# Patient Record
Sex: Female | Born: 1945
Health system: Southern US, Community
[De-identification: ages and names within clinical notes are randomized; demographics above are authoritative.]

## PROBLEM LIST (undated history)

## (undated) DIAGNOSIS — E041 Nontoxic single thyroid nodule: Secondary | ICD-10-CM

## (undated) DIAGNOSIS — I483 Typical atrial flutter: Secondary | ICD-10-CM

## (undated) DIAGNOSIS — I480 Paroxysmal atrial fibrillation: Secondary | ICD-10-CM

## (undated) DIAGNOSIS — D869 Sarcoidosis, unspecified: Secondary | ICD-10-CM

## (undated) HISTORY — DX: Typical atrial flutter: I48.3

## (undated) HISTORY — PX: ABLATION SAPHENOUS VEIN W/ RFA: SUR11

## (undated) HISTORY — PX: BREAST BIOPSY: SHX20

## (undated) HISTORY — PX: OTHER SURGICAL HISTORY: SHX169

## (undated) HISTORY — PX: HERNIA REPAIR: SHX51

## (undated) HISTORY — DX: Paroxysmal atrial fibrillation: I48.0

---

## 1971-04-05 DIAGNOSIS — D869 Sarcoidosis, unspecified: Secondary | ICD-10-CM

## 1971-04-05 HISTORY — DX: Sarcoidosis, unspecified: D86.9

## 2006-02-13 ENCOUNTER — Encounter: Payer: Self-pay | Admitting: Cardiology

## 2007-01-02 ENCOUNTER — Encounter: Admission: RE | Admit: 2007-01-02 | Discharge: 2007-01-02 | Payer: Self-pay | Admitting: Interventional Radiology

## 2007-01-23 ENCOUNTER — Encounter: Admission: RE | Admit: 2007-01-23 | Discharge: 2007-01-23 | Payer: Self-pay | Admitting: Interventional Radiology

## 2007-01-30 ENCOUNTER — Encounter: Admission: RE | Admit: 2007-01-30 | Discharge: 2007-01-30 | Payer: Self-pay | Admitting: Interventional Radiology

## 2007-02-20 ENCOUNTER — Encounter: Admission: RE | Admit: 2007-02-20 | Discharge: 2007-02-20 | Payer: Self-pay | Admitting: Interventional Radiology

## 2007-07-26 ENCOUNTER — Encounter: Payer: Self-pay | Admitting: Cardiology

## 2007-07-31 ENCOUNTER — Encounter: Admission: RE | Admit: 2007-07-31 | Discharge: 2007-07-31 | Payer: Self-pay | Admitting: Interventional Radiology

## 2008-04-01 ENCOUNTER — Encounter: Admission: RE | Admit: 2008-04-01 | Discharge: 2008-04-01 | Payer: Self-pay | Admitting: Interventional Radiology

## 2009-05-11 ENCOUNTER — Encounter: Payer: Self-pay | Admitting: Cardiology

## 2009-06-19 ENCOUNTER — Encounter: Payer: Self-pay | Admitting: Cardiology

## 2009-09-07 ENCOUNTER — Encounter: Payer: Self-pay | Admitting: Cardiology

## 2009-11-20 ENCOUNTER — Encounter: Payer: Self-pay | Admitting: Cardiology

## 2010-04-21 ENCOUNTER — Ambulatory Visit
Admission: RE | Admit: 2010-04-21 | Discharge: 2010-04-21 | Payer: Self-pay | Source: Home / Self Care | Attending: Cardiology | Admitting: Cardiology

## 2010-04-21 ENCOUNTER — Encounter: Payer: Self-pay | Admitting: Cardiology

## 2010-04-21 DIAGNOSIS — R002 Palpitations: Secondary | ICD-10-CM | POA: Insufficient documentation

## 2010-04-21 DIAGNOSIS — D869 Sarcoidosis, unspecified: Secondary | ICD-10-CM | POA: Insufficient documentation

## 2010-04-26 ENCOUNTER — Encounter: Payer: Self-pay | Admitting: Cardiology

## 2010-04-27 ENCOUNTER — Encounter: Payer: Self-pay | Admitting: Cardiology

## 2010-05-04 ENCOUNTER — Encounter (INDEPENDENT_AMBULATORY_CARE_PROVIDER_SITE_OTHER): Payer: Self-pay | Admitting: *Deleted

## 2010-05-04 NOTE — Letter (Signed)
Summary: External Correspondence/ [facility]  External Correspondence/ [facility]   Imported By: Dorise Hiss 03/03/2010 15:32:56  _____________________________________________________________________  External Attachment:    Type:   Image     Comment:   External Document

## 2010-05-04 NOTE — Letter (Signed)
Summary: External Correspondence/ [facility]  External Correspondence/ [facility]   Imported By: Dorise Hiss 03/03/2010 15:34:11  _____________________________________________________________________  External Attachment:    Type:   Image     Comment:   External Document

## 2010-05-06 ENCOUNTER — Encounter (INDEPENDENT_AMBULATORY_CARE_PROVIDER_SITE_OTHER): Payer: Self-pay | Admitting: *Deleted

## 2010-05-06 NOTE — Assessment & Plan Note (Signed)
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 The patient has recently retired from her job at [employer]. She has no prior history of cardiac problems. However over the last 3 months she describes episodes of "dropped beats". Typically these episodes last for seconds. They do occur almost on a daily basis. It appears that these episodes are followed by a brief period of "pause" with a sensation of dizziness associated with it. There is no shortness of breath or diaphoresis. There is no loss of consciousness. At other times after an episode like this she may describe a few palpitations. The episodes occur both at rest on exertion. The patient has a history of thyroid nodule but is currently not on thyroid replacement. She only takes aspirin mainly for arthritis. She has been diagnosed with sarcoidosis many years ago but there is no clinical indication of cardiac involvement. However she has not had a chest x-ray done in quite some time.  Danielle Nicholson does not report any chest pain on exertion or dyspnea on exertion. She remains very physically active and reports no limitations in her activities. She denies any nausea or vomiting or other symptoms.  Preventive Screening-Counseling & Management  Alcohol-Tobacco     Smoking Status: never  Current Medications (verified): 1)  Aspirin 325 Mg Tabs (Aspirin) .... Take 1 Tablet By Mouth Once A Day 2)  Fish Oil Double Strength 1200 Mg Caps (Omega-3 Fatty Acids) .... Take 2 Tablet By Mouth Once A Day  Allergies (verified): No Known Drug Allergies  Comments:  Nurse/Medical Assistant: The patient's medications and allergies were verbally reviewed with the patient and were updated in the Medication and Allergy Lists.  Past History:  Past Surgical  History: Last updated: 04/20/2010 Cervical node biopsy  Family History: Last updated: 04/20/2010 Mother had colon cancer  Social History: Last updated: 04/20/2010 Married  Retired -[employer] Tobacco Use - No.  Alcohol Use - no Drug Use - no  Past Medical History: Diverticulosis of the sigmoid colon stage I sarcoidosis diagnosed in 1973  Review of Systems       The patient complains of palpitations.  The patient denies fatigue, malaise, fever, weight gain/loss, vision loss, decreased hearing, hoarseness, chest pain, shortness of breath, prolonged cough, wheezing, sleep apnea, coughing up blood, abdominal pain, blood in stool, nausea, vomiting, diarrhea, heartburn, incontinence, blood in urine, muscle weakness, joint pain, leg swelling, rash, skin lesions, headache, fainting, dizziness, depression, anxiety, enlarged lymph nodes, easy bruising or bleeding, and environmental allergies.    Vital Signs:  Patient profile:   65 year old female Height:      70 inches Weight:      178 pounds BMI:     25.63 Pulse rate:   71 / minute BP sitting:   127 / 82  (left arm) Cuff size:   regular  Vitals Entered By: Danielle Nicholson (April 21, 2010 8:42 AM)  Nutrition Counseling: Patient's BMI is greater than 25 and therefore counseled on weight management options.  Physical Exam  Additional Exam:  General: Well-developed, well-nourished in no distress head: Normocephalic and atraumatic eyes PERRLA/EOMI intact, conjunctiva and lids normal nose: No deformity or lesions mouth normal dentition, normal posterior pharynx neck: Supple, no JVD.  No masses, thyromegaly or abnormal cervical nodes lungs: Normal breath sounds bilaterally without wheezing.  Normal percussion heart: regular rate and rhythm with normal S1 and S2, no S3 or S4.  PMI is normal.  No pathological murmurs abdomen: Normal bowel sounds, abdomen is soft and nontender without masses, organomegaly or hernias noted.  No  hepatosplenomegaly musculoskeletal: Back normal, normal gait muscle strength and tone normal pulsus: Pulse is normal in all 4 extremities Extremities: No peripheral pitting edema neurologic: Alert and oriented x 3 skin: Intact without lesions or rashes cervical nodes: No significant adenopathy psychologic: Normal affect    EKG  Procedure date:  04/21/2010  Findings:      normal sinus rhythm no acute ischemic changes. No evidence of preexcitation.  Impression & Recommendations:  Problem # 1:  PALPITATIONS (ICD-785.1) Baseline electrocardiogram shows no evidence of preexcitation or other repolarization abnormality. We will obtain a 48-hour Holter monitor as her symptoms appear to occurring daily.the patient also needs an echocardiogram to rule out structural heart disease. Her updated medication list for this problem includes:    Aspirin 325 Mg Tabs (Aspirin) .Marland Kitchen... Take 1 tablet by mouth once a day  Orders: EKG w/ Interpretation (93000) Cardionet/Event Monitor (Cardionet/Event) 2-D Echocardiogram (2D Echo)  Problem # 2:  SARCOIDOSIS (ICD-135) the patient has a history of stage I sarcoidosis diagnosed 30-35 years ago. I do think it is very unlikely that she has cardiac sarcoidosis with secondary arrhythmias. Again we'll obtain an echocardiogram and also a chest x-ray to see there is any evidence of pulmonary sarcoidosis.further evaluation may include a cardiac MRI.   Orders: EKG w/ Interpretation (93000) Cardionet/Event Monitor (Cardionet/Event) 2-D Echocardiogram (2D Echo)  Other Orders: T-Chest x-ray, 2 views (57846)  Patient Instructions: 1)  Chest x-ray 2)  2D Echo 3)  48 hour holter monitor 4)  Follow up in  6 weeks  Appended Document: Orders Update    Clinical Lists Changes  Orders: Added new Referral order of Holter Monitor (Holter Monitor) - Signed

## 2010-05-06 NOTE — Letter (Signed)
Summary: Internal Other/ PATIENT HISTORY FORM  Internal Other/ PATIENT HISTORY FORM   Imported By: Dorise Hiss 04/26/2010 16:00:54  _____________________________________________________________________  External Attachment:    Type:   Image     Comment:   External Document

## 2010-05-12 NOTE — Letter (Signed)
Summary: Engineer, materials at [facility]  [address]   [place], [place] [address]   Phone: [phone]  Fax: [phone]        May 06, 2010 MRN: [id number]   [facility] [place] [address] [place], [place]  [address]   Dear Ms. Coronado,  Your test ordered by Selena Batten has been reviewed by your physician (or physician assistant) and was found to be normal or stable. Your physician (or physician assistant) felt no changes were needed at this time.  ____ Echocardiogram  ____ Cardiac Stress Test  ____ Lab Work  ____ Peripheral vascular study of arms, legs or neck  ____ CT scan or X-ray  ____ Lung or Breathing test  __X__ Other:  Holter monitor was obtained to rule out arrythmias in the setting of sarcoidosis.  No evidence of arrythmias per Dr. Andee Lineman.   Thank you.   Hoover Brunette, LPN    Duane Boston, M.D., F.A.C.C. Thressa Sheller, M.D., F.A.C.C. Oneal Grout, M.D., F.A.C.C. Cheree Ditto, M.D., F.A.C.C. Daiva Nakayama, M.D., F.A.C.C. Kenney Houseman, M.D., F.A.C.C. Jeanne Ivan, PA-C

## 2010-05-12 NOTE — Letter (Signed)
Summary: Engineer, materials at [facility]  [address]   [place], [place] [address]   Phone: [phone]  Fax: [phone]        May 04, 2010 MRN: [id number]   [facility] [place] [address] [place], [place]  [address]   Dear Ms. Angevine,  Your test ordered by Selena Batten has been reviewed by your physician (or physician assistant) and was found to be normal or stable. Your physician (or physician assistant) felt no changes were needed at this time.  __X__ Echocardiogram  ____ Cardiac Stress Test  ____ Lab Work  ____ Peripheral vascular study of arms, legs or neck  __X__ CT scan or X-ray - chest, no active sarcoidosis per Dr. Andee Lineman.  ____ Lung or Breathing test  ____ Other:   Thank you.   Hoover Brunette, LPN    Duane Boston, M.D., F.A.C.C. Thressa Sheller, M.D., F.A.C.C. Oneal Grout, M.D., F.A.C.C. Cheree Ditto, M.D., F.A.C.C. Daiva Nakayama, M.D., F.A.C.C. Kenney Houseman, M.D., F.A.C.C. Jeanne Ivan, PA-C

## 2010-05-12 NOTE — Procedures (Signed)
Summary: Holter and Event/ [facility]  Holter and Event/ [facility]   Imported By: Dorise Hiss 05/06/2010 12:22:15  _____________________________________________________________________  External Attachment:    Type:   Image     Comment:   External Document  Appended Document: Holter and Event/ [facility] Holter monitor was obtained to rule out arrhythmias in the setting of sarcoidosis.  There is no evidence of arrhythmias.   Appended Document: Holter and Event/ [facility] Patient notified by letter.

## 2010-06-08 ENCOUNTER — Encounter: Payer: Self-pay | Admitting: Cardiology

## 2010-06-08 ENCOUNTER — Ambulatory Visit (INDEPENDENT_AMBULATORY_CARE_PROVIDER_SITE_OTHER): Payer: Medicare Other | Admitting: Cardiology

## 2010-06-08 DIAGNOSIS — R002 Palpitations: Secondary | ICD-10-CM

## 2010-06-22 NOTE — Assessment & Plan Note (Signed)
Summary: fu 6 wk-agh vs   Visit Type:  Follow-up Primary Provider:  Dhruv Vyas,MD   History of Present Illness: The patient had a recent echocardiogram done in January 2012.  Chest normal ejection fraction estimated at 60 to 65%.  She has no valvular abnormalities.  The patient also had a chest x-ray done which showed no adenopathy.  There were no inflammatory lesions or sarcoid seen. The patient also had a Holter monitor done to rule out arrhythmias in the setting of sarcoidosis.  There was no evidence of arrhythmias. As previously reported the patient is a retired Scientist, clinical (histocompatibility and immunogenetics) with a prior history of stage I sarcoidosis dating back to 1973.  She had been complaining of dropped beats.  She had some sensation of dizziness.  Otherwise she had no complaints.  He did have these are far of nodule that was multiple or replacement. The patient over the last 3 weeks reports no recurrent palpitations.  She has been feeling great.  She denies any shortness of breath orthopnea PND.   Preventive Screening-Counseling & Management  Alcohol-Tobacco     Smoking Status: never  Current Medications (verified): 1)  Aspirin 325 Mg Tabs (Aspirin) .... Take 1 Tablet By Mouth Once A Day 2)  Fish Oil Double Strength 1200 Mg Caps (Omega-3 Fatty Acids) .... Take 2 Tablet By Mouth Once A Day 3)  Metoprolol Tartrate 25 Mg Tabs (Metoprolol Tartrate) .... May Take Up To Two Times A Day As Needed Palpitations  Allergies (verified): No Known Drug Allergies  Comments:  Nurse/Medical Assistant: The patient's medications and allergies were verbally reviewed with the patient and were updated in the Medication and Allergy Lists.  Past History:  Past Medical History: Last updated: 04/21/2010 Diverticulosis of the sigmoid colon stage I sarcoidosis diagnosed in 1973  Past Surgical History: Last updated: 04/20/2010 Cervical node biopsy  Family History: Last updated: 04/20/2010 Mother had colon cancer  Social  History: Last updated: 04/20/2010 Married  Retired -[employer] Tobacco Use - No.  Alcohol Use - no Drug Use - no  Risk Factors: Smoking Status: never (06/08/2010)  Review of Systems  The patient denies fatigue, malaise, fever, weight gain/loss, vision loss, decreased hearing, hoarseness, chest pain, palpitations, shortness of breath, prolonged cough, wheezing, sleep apnea, coughing up blood, abdominal pain, blood in stool, nausea, vomiting, diarrhea, heartburn, incontinence, blood in urine, muscle weakness, joint pain, leg swelling, rash, skin lesions, headache, fainting, dizziness, depression, anxiety, enlarged lymph nodes, easy bruising or bleeding, and environmental allergies.    Vital Signs:  Patient profile:   65 year old female Height:      70 inches Weight:      179 pounds Pulse rate:   72 / minute BP sitting:   140 / 80  (left arm) Cuff size:   regular  Vitals Entered By: Carlye Grippe (June 08, 2010 10:10 AM)  Physical Exam  Additional Exam:  General: Well-developed, well-nourished in no distress head: Normocephalic and atraumatic eyes PERRLA/EOMI intact, conjunctiva and lids normal nose: No deformity or lesions mouth normal dentition, normal posterior pharynx neck: Supple, no JVD.  No masses, thyromegaly or abnormal cervical nodes lungs: Normal breath sounds bilaterally without wheezing.  Normal percussion heart: regular rate and rhythm with normal S1 and S2, no S3 or S4.  PMI is normal.  No pathological murmurs abdomen: Normal bowel sounds, abdomen is soft and nontender without masses, organomegaly or hernias noted.  No hepatosplenomegaly musculoskeletal: Back normal, normal gait muscle strength and tone normal pulsus: Pulse  is normal in all 4 extremities Extremities: No peripheral pitting edema neurologic: Alert and oriented x 3 skin: Intact without lesions or rashes cervical nodes: No significant adenopathy psychologic: Normal affect    Impression &  Recommendations:  Problem # 1:  PALPITATIONS (ICD-785.1) palpitations: No significant arrhythmias by Holter monitor.  No structural heart disease suggestive of cardiac sarcoidosis.  Also the chest x-ray was within normal limits.  No clear indication to proceed with MRI to rule out cardiac sarcoidosis.  Clinically she has no recurrent palpitations.  I did give the patient a prescription for p.r.n. metoprolol 25 mg p.o. b.i.d. as needed to adjust palpitations again.   Her updated medication list for this problem includes:    Aspirin 325 Mg Tabs (Aspirin) .Marland Kitchen... Take 1 tablet by mouth once a day    Metoprolol Tartrate 25 Mg Tabs (Metoprolol tartrate) ..... May take up to two times a day as needed palpitations  Problem # 2:  SARCOIDOSIS (ICD-135) Sarcoidosis: No evidence of active disease  Patient Instructions: 1)  Metoprolol tart 25mg  - may take up to two times a day as needed palpitations 2)  Follow up in  1 year Prescriptions: METOPROLOL TARTRATE 25 MG TABS (METOPROLOL TARTRATE) may take up to two times a day as needed palpitations  #30 x 3   Entered by:   Hoover Brunette, LPN   Authorized by:   Lewayne Bunting, MD, [facility]   Signed by:   Hoover Brunette, LPN on 04/54/0981   Method used:   Electronically to        [facility]* (retail)       [address]       [place], [place]  [address]       Ph: [phone]       Fax: [phone]   RxID:   [id number]

## 2010-10-27 ENCOUNTER — Other Ambulatory Visit (INDEPENDENT_AMBULATORY_CARE_PROVIDER_SITE_OTHER): Payer: Self-pay | Admitting: *Deleted

## 2010-10-27 ENCOUNTER — Encounter (INDEPENDENT_AMBULATORY_CARE_PROVIDER_SITE_OTHER): Payer: Self-pay | Admitting: *Deleted

## 2010-10-27 DIAGNOSIS — R194 Change in bowel habit: Secondary | ICD-10-CM

## 2010-10-27 DIAGNOSIS — R1032 Left lower quadrant pain: Secondary | ICD-10-CM

## 2010-10-27 DIAGNOSIS — Z800 Family history of malignant neoplasm of digestive organs: Secondary | ICD-10-CM

## 2010-10-29 ENCOUNTER — Telehealth (INDEPENDENT_AMBULATORY_CARE_PROVIDER_SITE_OTHER): Payer: Self-pay | Admitting: *Deleted

## 2010-10-29 NOTE — Telephone Encounter (Signed)
A user error has taken place, disregard.

## 2010-11-05 ENCOUNTER — Ambulatory Visit (HOSPITAL_COMMUNITY)
Admission: RE | Admit: 2010-11-05 | Discharge: 2010-11-05 | Disposition: A | Discharge status: Home / Self Care | Payer: Medicare Other | Source: Ambulatory Visit | Attending: Internal Medicine | Admitting: Internal Medicine

## 2010-11-05 ENCOUNTER — Encounter (HOSPITAL_COMMUNITY): Payer: Self-pay | Admitting: *Deleted

## 2010-11-05 ENCOUNTER — Encounter (HOSPITAL_COMMUNITY)
Admission: RE | Disposition: A | Discharge status: Home / Self Care | Payer: Self-pay | Source: Ambulatory Visit | Attending: Internal Medicine

## 2010-11-05 DIAGNOSIS — K5730 Diverticulosis of large intestine without perforation or abscess without bleeding: Secondary | ICD-10-CM | POA: Insufficient documentation

## 2010-11-05 DIAGNOSIS — Z800 Family history of malignant neoplasm of digestive organs: Secondary | ICD-10-CM

## 2010-11-05 DIAGNOSIS — R198 Other specified symptoms and signs involving the digestive system and abdomen: Secondary | ICD-10-CM | POA: Insufficient documentation

## 2010-11-05 DIAGNOSIS — R1032 Left lower quadrant pain: Secondary | ICD-10-CM | POA: Insufficient documentation

## 2010-11-05 DIAGNOSIS — K644 Residual hemorrhoidal skin tags: Secondary | ICD-10-CM | POA: Insufficient documentation

## 2010-11-05 HISTORY — PX: COLONOSCOPY: SHX5424

## 2010-11-05 HISTORY — DX: Sarcoidosis, unspecified: D86.9

## 2010-11-05 SURGERY — COLONOSCOPY
Anesthesia: Moderate Sedation

## 2010-11-05 MED ORDER — MEPERIDINE HCL 50 MG/ML IJ SOLN
INTRAMUSCULAR | Status: DC | PRN
  Administered 2010-11-05 (×2): 25 mg via INTRAVENOUS

## 2010-11-05 MED ORDER — MIDAZOLAM HCL 5 MG/5ML IJ SOLN
INTRAMUSCULAR | Status: DC | PRN
  Administered 2010-11-05: 1 mg via INTRAVENOUS
  Administered 2010-11-05 (×2): 2 mg via INTRAVENOUS

## 2010-11-05 MED ORDER — STERILE WATER FOR IRRIGATION IR SOLN
Status: DC | PRN
  Administered 2010-11-05: 10:00:00

## 2010-11-05 MED ORDER — MEPERIDINE HCL 50 MG/ML IJ SOLN
INTRAMUSCULAR | Status: AC
  Filled 2010-11-05: qty 1

## 2010-11-05 MED ORDER — MIDAZOLAM HCL 5 MG/5ML IJ SOLN
INTRAMUSCULAR | Status: AC
  Filled 2010-11-05: qty 10

## 2010-11-05 MED ORDER — SODIUM CHLORIDE 0.45 % IV SOLN
Freq: Once | INTRAVENOUS | Status: AC
  Administered 2010-11-05: 09:00:00 via INTRAVENOUS

## 2010-11-05 NOTE — Op Note (Signed)
COLONOSCOPY PROCEDURE REPORT  PATIENT:  Danielle Nicholson  MR#:  [id number] Birthdate:  [birth year], 65 y.o., female Endoscopist:  Dr. Malissa Hippo, MD Referred By:  Dr. Doreen Beam Procedure Date: 11/05/2010  Procedure:   Colonoscopy  Indications: Change in bowel habits, LLQ abdominal pain and family history of colon carcinoma.  Informed Consent:  The procedure and risks were reviewed with the patient and informed consent was obtained. All of her questions were answered. Medications:  Demerol 50mg  IV Versed 5mg  IV  Description of procedure:  After a digital rectal exam was performed, that colonoscope was advanced from the anus through the rectum and colon to the area of the cecum, ileocecal valve and appendiceal orifice. The cecum was deeply intubated. These structures were well-seen and photographed for the record. From the level of the cecum and ileocecal valve, the scope was slowly and cautiously withdrawn. The mucosal surfaces were carefully surveyed utilizing scope tip to flexion to facilitate fold flattening as needed. The scope was pulled down into the rectum where a thorough exam including retroflexion was performed.  Findings:  Moderate number of diverticula and sigmoid colon with few at descending and transverse colon. Small external hemorrhoids.  Therapeutic/Diagnostic Maneuvers Performed:  None  Complications:  None  Cecal Withdrawal Time:  10 minutes  Impression:  Examination performed to cecum. Moderate number of diverticula and sigmoid colon a few more at descending and transverse colon. No evidence of colonic stricture or polyps.  Recommendations:  Standard instructions given. High fiber diet. Fiber supplement 3-4 g daily Activa one cup daily for 2 months. Consider next exam in 5 years.    REHMAN,NAJEEB U  11/05/2010 11:17 AM  CC: Dr. Ignatius Specking., MD, MD & Dr. Bonnetta Barry ref. provider found

## 2010-11-05 NOTE — H&P (Signed)
Primary Care Physician:  Ignatius Specking., MD, MD Primary Gastroenterologist:  Dr. Karilyn Cota  Pre-Procedure History & Physical: HPI:  Danielle Nicholson is a 65 y.o. female here for diagnostic colonoscopy. She has noted change in her bowel habits decrease in caliber of her stools as well as LLQ abdominal pain. No history of rectal bleeding, anorexia or weight loss. Her last colonoscopy was about 5 years ago in [facility]. Family history is positive for colon carcinoma and her mother went to surgery in her early 64s.  Past Medical History  Diagnosis Date  . Sarcoidosis   . Diverticula of colon     Past Surgical History  Procedure Date  . Breast biopsy     right  . Cervical node     left bx  . Hernia repair     right  . Ablation saphenous vein w/ rfa     Prior to Admission medications   Medication Sig Start Date End Date Taking? Authorizing Provider  aspirin 81 MG tablet Take 81 mg by mouth daily.     Yes Historical Provider, MD  Calcium Acetate, Phos Binder, (CALCIUM ACETATE PO) Take by mouth.     Yes Historical Provider, MD  Omega-3 Fatty Acids (FISH OIL PO) Take by mouth.     Yes Historical Provider, MD    No Known Allergies  Family History  Problem Relation Age of Onset  . Colon cancer Mother     History   Social History  . Marital Status: Married    Spouse Name: N/A    Number of Children: N/A  . Years of Education: N/A   Occupational History  . Not on file.   Social History Main Topics  . Smoking status: Never Smoker   . Smokeless tobacco: Not on file  . Alcohol Use: 1.2 oz/week    2 Glasses of wine per week  . Drug Use: No  . Sexually Active:    Other Topics Concern  . Not on file   Social History Narrative  . No narrative on file    Review of Systems: See HPI, otherwise negative ROS  Physical Exam: BP 132/74  Pulse 76  Temp(Src) 98 F (36.7 C) (Oral)  Resp 18  Ht 5\' 10"  (1.778 m)  Wt 179 lb (81.194 kg)  BMI 25.68 kg/m2  SpO2 96% General:    Alert,  Well-developed, well-nourished, pleasant and cooperative in NAD Head:  Normocephalic and atraumatic. Eyes:  Sclera clear, no icterus.   Conjunctiva pink. Mouth:  Oropharyngeal mucosa is normal.  Neck:  Supple; no masses or thyromegaly. Lungs:  Clear throughout to auscultation.   No wheezes, crackles, or rhonchi. No acute distress. Heart:  Regular rate and rhythm; no murmurs, clicks, rubs,  or gallops. Abdomen:  Soft, nontender and nondistended. No masses, hepatosplenomegaly or hernias noted. Normal bowel sounds, without guarding, and without rebound.   Rectal:  Deferred until time of colonoscopy.   Extremities:  Without clubbing or edema.    Impression/Plan:   LLQ abdominal pain and change in her bowel habits. Family history of colorectal carcinoma. Will proceed with colonoscopy.

## 2010-11-12 ENCOUNTER — Encounter (HOSPITAL_COMMUNITY): Payer: Self-pay | Admitting: Internal Medicine

## 2011-06-13 ENCOUNTER — Ambulatory Visit: Payer: Medicare Other | Admitting: Cardiology

## 2011-06-16 ENCOUNTER — Encounter: Payer: Self-pay | Admitting: *Deleted

## 2011-06-27 ENCOUNTER — Encounter: Payer: Self-pay | Admitting: Cardiology

## 2011-06-27 ENCOUNTER — Ambulatory Visit (INDEPENDENT_AMBULATORY_CARE_PROVIDER_SITE_OTHER): Payer: Medicare Other | Admitting: Cardiology

## 2011-06-27 VITALS — BP 137/80 | HR 67 | Ht 70.0 in | Wt 184.0 lb

## 2011-06-27 DIAGNOSIS — R002 Palpitations: Secondary | ICD-10-CM

## 2011-06-27 MED ORDER — PROPRANOLOL HCL ER 60 MG PO CP24: 60.0000 mg | ORAL_CAPSULE | Freq: Every evening | ORAL | Status: DC

## 2011-06-27 NOTE — Patient Instructions (Signed)
   Begin Propranolol LA 60mg  every evening Your physician wants you to follow up in: 6 months.  You will receive a reminder letter in the mail one-two months in advance.  If you don't receive a letter, please call our office to schedule the follow up appointment

## 2011-06-28 ENCOUNTER — Encounter: Payer: Self-pay | Admitting: Cardiology

## 2011-06-28 NOTE — Progress Notes (Signed)
Peyton Bottoms, MD, [facility] Board Certified in Adult Cardiovascular Medicine,Internal Medicine and Critical Care Medicine    [id number] patient with a history of palpitations  HPI:  The patient is doing well.  She reports occasional palpitations which are associated with lightheadedness.  She feels occasional skipped beats.  She states this doesn't last very long but just for a few seconds.  There is no associated syncope.  She has no orthopnea or PND she reports no chest pain   PMH: reviewed and listed in Problem List in Electronic Records (and see below) Past Medical History  Diagnosis Date  . Sarcoidosis 1973  . Diverticula of colon   . Palpitations     normal echocardiogram and 48 hour Holter monitor in 2012   Past Surgical History  Procedure Date  . Breast biopsy     right  . Cervical node     left bx  . Hernia repair     right  . Ablation saphenous vein w/ rfa   . Colonoscopy 11/05/2010    Procedure: COLONOSCOPY;  Surgeon: Malissa Hippo, MD;  Location: [facility];  Service: Endoscopy;  Laterality: N/A;  9:30 am    Allergies/SH/FHX : available in Electronic Records for review  No Known Allergies History   Social History  . Marital Status: Married    Spouse Name: N/A    Number of Children: N/A  . Years of Education: N/A   Occupational History  . Not on file.   Social History Main Topics  . Smoking status: Never Smoker   . Smokeless tobacco: Never Used  . Alcohol Use: 1.2 oz/week    2 Glasses of wine per week  . Drug Use: No  . Sexually Active: Not on file   Other Topics Concern  . Not on file   Social History Narrative  . No narrative on file   Family History  Problem Relation Age of Onset  . Colon cancer Mother     Medications: Current Outpatient Prescriptions  Medication Sig Dispense Refill  . aspirin 81 MG tablet Take 81 mg by mouth daily.        . Multiple Vitamin (MULTIVITAMIN) tablet Take 1 tablet by mouth daily.      . Naproxen  Sodium (ALEVE) 220 MG CAPS Take 1 capsule by mouth as needed.      . propranolol (INDERAL LA) 60 MG 24 hr capsule Take 1 capsule (60 mg total) by mouth every evening.  30 capsule  6    ROS: No nausea or vomiting. No fever or chills.No melena or hematochezia.No bleeding.No claudication  Physical Exam: BP 137/80  Pulse 67  Ht 5\' 10"  (1.778 m)  Wt 184 lb (83.462 kg)  BMI 26.40 kg/m2 General:well-nourished white female in no distress Neck:normal carotid upstroke and no carotid bruits.  JVP is normal Lungs:clear breath sounds bilaterally without wheezing Cardiac:regular rate and rhythm normal S1-S2 with no murmur or gallops Vascular:no edema.  Normal distal pulses Skin:skin warm and dry Physcologic:normal affect  12lead UJW:[id number] sinus rhythm no acute changes Limited bedside ECHO:N/A No images are attached to the encounter.    Patient Active Problem List  Diagnoses  . SARCOIDOSIS  . PALPITATIONS    PLAN   The patient still has intermittent palpitations.  She has not used when necessary beta blocker.  I suggested she try a small dose of propranolol 60 mg XL to take in the evening and assess if this would help with her palpitations.  I asked her to monitor heart rate and blood pressure and if she should become bradycardic she can stop the medication.

## 2011-08-18 ENCOUNTER — Other Ambulatory Visit: Payer: Self-pay | Admitting: Orthopedic Surgery

## 2011-08-18 NOTE — Progress Notes (Signed)
Preoperative surgical orders have been place into the Epic hospital system for Danielle Nicholson on 08/18/2011, 5:44 PM  by Patrica Duel for surgery on 11/02/2011.  Preop Total Knee orders including Bupivacaine On-Q pump, IV Tylenol, and IV Decadron as long as there are no contraindications to the above medications.

## 2011-10-19 NOTE — H&P (Signed)
Freya C. Porras DOB: 1945-08-19  Chief Complaint: left knee pain   History of Present Illness The patient is a 66 year old female who comes in today for a preoperative History and Physical. The patient is scheduled for a left total knee arthroplasty to be performed by Dr. Gus Rankin. Aluisio, MD at [facility] on Wednesday November 02, 2011 . She has had left knee pain for over a year. She states that the knee is preventing her from doing things she desires. She does have a lot of pain on the medial part of her knee but even worse than the pain is the instability. She has had multiple falls because the knee gives out on her. She has had problems with the knee for several years getting much worse recently. 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 She has had injections in the past which provided only short term benefit. Due to failure of conservative measures, the most predictable means for increased function and decreased pain in the left knee is a left total knee arthroplasty. Risks and benefits of the surgery discussed.    Problem List/Past Medical Osteoarthritis, Knee (715.96) Sarcoidosis Cardiac Arrhythmia Diverticulitis Of Colon Thyroid Nodule Menopause  Allergies No Known Drug Allergies.    Family History Cerebrovascular Accident. mother Chronic Obstructive Lung Disease. mother Drug / Alcohol Addiction. father Cancer. mother Hypertension. mother Osteoarthritis. sister Father. deceased age 49 due to suicide Mother. age 66; CVA, COPD, colon cancer   Social History Living situation. live with spouse Marital status. 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 no Exercise. Exercises rarely; does running / walking Tobacco use. never smoker Tobacco / smoke exposure. no Number of flights of stairs before winded. 2-3 Pain Contract.  no Previously in rehab. no Children. 1 Alcohol use. current drinker; drinks wine; less than 5 per week Post-Surgical Plans. home with husband Advance Directives. none   Medication History Propranolol HCl (60MG  Capsule ER 24HR, Oral) Active. Aleve ( Oral) Specific dose unknown - Active.   Past Surgical History Breast Biopsy. right Inguinal Hernia Repair. open: right   Review of Systems General:Not Present- Chills, Fever, Night Sweats, Fatigue, Weight Gain, Weight Loss and Memory Loss. Skin:Not Present- Hives, Itching, Rash, Eczema and Lesions. HEENT:Not Present- Tinnitus, Headache, Double Vision, Visual Loss, Hearing Loss and Dentures. Respiratory:Not Present- Shortness of breath with exertion, Shortness of breath at rest, Allergies, Coughing up blood and Chronic Cough. Cardiovascular:Not Present- Chest Pain, Racing/skipping heartbeats, Difficulty Breathing Lying Down, Murmur, Swelling and Palpitations. Gastrointestinal:Not Present- Bloody Stool, Heartburn, Abdominal Pain, Vomiting, Nausea, Constipation, Diarrhea, Difficulty Swallowing, Jaundice and Loss of appetitie. Female Genitourinary:Not Present- Blood in Urine, Urinary frequency, Weak urinary stream, Discharge, Flank Pain, Incontinence, Painful Urination, Urgency, Urinary Retention and Urinating at Night. Musculoskeletal:Present- Joint Pain. Not Present- Muscle Weakness, Muscle Pain, Joint Swelling, Back Pain, Morning Stiffness and Spasms. Neurological:Not Present- Tremor, Dizziness, Blackout spells, Paralysis, Difficulty with balance and Weakness. Psychiatric:Not Present- Insomnia.   Vitals Pulse: 62 (Regular) BP: 132/75 (Sitting, Right Arm, Standard)    Physical Exam General Mental Status - Alert, cooperative and good historian. General Appearance- pleasant. Not in acute distress. Orientation- Oriented X3. Build & Nutrition- Overweight, Well nourished and Well developed. Head and  Neck Head- normocephalic, atraumatic . Neck Global Assessment- supple. no bruit auscultated on the right and no bruit auscultated on the left. Eye Pupil- Bilateral- Regular and Round. Motion- Bilateral- EOMI Chest and Lung Exam Auscultation: Breath sounds:-  clear at anterior chest wall and - clear at posterior chest wall. Adventitious sounds:- No Adventitious sounds. Cardiovascular Auscultation:Rhythm- Regular rate and rhythm. Heart Sounds- S1 WNL and S2 WNL. Murmurs & Other Heart Sounds:Auscultation of the heart reveals - No Murmurs. Abdomen Palpation/Percussion:Tenderness- Abdomen is non-tender to palpation. Rigidity (guarding)- Abdomen is soft. Auscultation:Auscultation of the abdomen reveals - Bowel sounds normal. Female Genitourinary Not done, not pertinent to present illness Peripheral Vascular Upper Extremity: Palpation:- Pulses bilaterally normal. Lower Extremity: Palpation:- Pulses bilaterally normal. Neurologic Examination of related systems reveals - normal muscle strength and tone in all extremities. Neurologic evaluation reveals - normal sensation. Musculoskeletal Evaluation of her hips shows normal range of motion, no discomfort. The right knee shows no effusion. Range is 0 to 135 on the right. There is no tenderness or instability. The left knee varus deformity, range 5 to 125, moderate crepitus on range of motion, tenderness medial greater than lateral with no instability noted.   RADIOGRAPHS: AP both knees and lateral show some medial joint space narrowing in the right knee. The left knee complete bone on bone medial and patellofemoral with tibial subluxation and large varus deformity.     Assessment & Plan Osteoarthritis, Knee (715.96) Left total knee arthroplasty    Dimitri Ped, PA-C

## 2011-10-24 ENCOUNTER — Encounter (HOSPITAL_COMMUNITY): Payer: Self-pay | Admitting: Pharmacy Technician

## 2011-10-25 ENCOUNTER — Ambulatory Visit (HOSPITAL_COMMUNITY)
Admission: RE | Admit: 2011-10-25 | Discharge: 2011-10-25 | Disposition: A | Discharge status: Home / Self Care | Payer: Medicare Other | Source: Ambulatory Visit | Attending: Orthopedic Surgery | Admitting: Orthopedic Surgery

## 2011-10-25 ENCOUNTER — Encounter (HOSPITAL_COMMUNITY): Payer: Self-pay

## 2011-10-25 ENCOUNTER — Encounter (HOSPITAL_COMMUNITY)
Admission: RE | Admit: 2011-10-25 | Discharge: 2011-10-25 | Disposition: A | Discharge status: Home / Self Care | Payer: Medicare Other | Source: Ambulatory Visit | Attending: Orthopedic Surgery | Admitting: Orthopedic Surgery

## 2011-10-25 DIAGNOSIS — Z01812 Encounter for preprocedural laboratory examination: Secondary | ICD-10-CM | POA: Insufficient documentation

## 2011-10-25 DIAGNOSIS — D869 Sarcoidosis, unspecified: Secondary | ICD-10-CM | POA: Insufficient documentation

## 2011-10-25 DIAGNOSIS — M1710 Unilateral primary osteoarthritis, unspecified knee: Secondary | ICD-10-CM | POA: Insufficient documentation

## 2011-10-25 DIAGNOSIS — R002 Palpitations: Secondary | ICD-10-CM | POA: Insufficient documentation

## 2011-10-25 HISTORY — DX: Nontoxic single thyroid nodule: E04.1

## 2011-10-25 LAB — URINALYSIS, ROUTINE W REFLEX MICROSCOPIC
Bilirubin Urine: NEGATIVE
Glucose, UA: NEGATIVE mg/dL
pH: 6 (ref 5.0–8.0)

## 2011-10-25 LAB — URINE MICROSCOPIC-ADD ON

## 2011-10-25 LAB — COMPREHENSIVE METABOLIC PANEL
CO2: 27 mEq/L (ref 19–32)
Calcium: 9.7 mg/dL (ref 8.4–10.5)
Creatinine, Ser: 0.76 mg/dL (ref 0.50–1.10)
GFR calc Af Amer: >90 mL/min (ref >90)
GFR calc non Af Amer: 85 mL/min — ABNORMAL LOW (ref >90)
Glucose, Bld: 100 mg/dL — ABNORMAL HIGH (ref 70–99)
Total Protein: 7.1 g/dL (ref 6.0–8.3)

## 2011-10-25 LAB — CBC
Hemoglobin: 14.1 g/dL (ref 12.0–15.0)
MCH: 31 pg (ref 26.0–34.0)
MCHC: 32.8 g/dL (ref 30.0–36.0)
MCV: 94.5 fL (ref 78.0–100.0)
RBC: 4.55 MIL/uL (ref 3.87–5.11)

## 2011-10-25 LAB — PROTIME-INR
INR: 0.92 (ref 0.00–1.49)
Prothrombin Time: 12.6 seconds (ref 11.6–15.2)

## 2011-10-25 NOTE — Pre-Procedure Instructions (Signed)
Sent Fax to Dr. Lequita Halt about urine routine and microscopic results.

## 2011-10-25 NOTE — Patient Instructions (Signed)
19 Danielle Nicholson  10/25/2011   Your procedure is scheduled on:  Wednesday 11/02/2011 at 1015 am  Report to [facility] at 0745 AM.  Call this number if you have problems the morning of surgery: [phone]   Remember:   Do not eat food:After Midnight.  May have clear liquids:until Midnight .    Take these medicines the morning of surgery with A SIP OF WATER:NONE   Do not wear jewelry  Do not wear lotions, powders, or perfumes.   Do not shave 48 hours prior to surgery. Men may shave face and neck.  Do not bring valuables to the hospital.  Contacts, dentures or bridgework may not be worn into surgery.  Leave suitcase in the car. After surgery it may be brought to your room.  For patients admitted to the hospital, checkout time is 11:00 AM the day of discharge.       Special Instructions: CHG Shower Use Special Wash: 1/2 bottle night before surgery and 1/2 bottle morning of surgery.   Please read over the following fact sheets that you were given: MRSA Information, Blood transfusion sheet, Incentive Spirometry sheet, Sleep apnea sheet                   If you have any questions ,please call me at [phone] [facility].Georgeanna Lea, RN,BSN

## 2011-10-25 NOTE — Pre-Procedure Instructions (Signed)
Reviewed with patient pre-op instructions using Teach back method. 

## 2011-10-25 NOTE — Pre-Procedure Instructions (Signed)
Received Fax back from Dr.Aluisio about urine results- script for abx called in for pt.

## 2011-11-02 ENCOUNTER — Encounter (HOSPITAL_COMMUNITY): Payer: Self-pay | Admitting: Registered Nurse

## 2011-11-02 ENCOUNTER — Inpatient Hospital Stay (HOSPITAL_COMMUNITY)
Admission: RE | Admit: 2011-11-02 | Discharge: 2011-11-04 | DRG: 470 | Disposition: A | Discharge status: Home Health | Payer: Medicare Other | Source: Ambulatory Visit | Attending: Orthopedic Surgery | Admitting: Orthopedic Surgery

## 2011-11-02 ENCOUNTER — Encounter (HOSPITAL_COMMUNITY): Payer: Self-pay | Admitting: Orthopedic Surgery

## 2011-11-02 ENCOUNTER — Ambulatory Visit (HOSPITAL_COMMUNITY): Payer: Medicare Other | Admitting: Registered Nurse

## 2011-11-02 ENCOUNTER — Encounter (HOSPITAL_COMMUNITY): Payer: Self-pay

## 2011-11-02 ENCOUNTER — Encounter (HOSPITAL_COMMUNITY)
Admission: RE | Disposition: A | Discharge status: Home Health | Payer: Self-pay | Source: Ambulatory Visit | Attending: Orthopedic Surgery

## 2011-11-02 DIAGNOSIS — M179 Osteoarthritis of knee, unspecified: Secondary | ICD-10-CM | POA: Diagnosis present

## 2011-11-02 DIAGNOSIS — D869 Sarcoidosis, unspecified: Secondary | ICD-10-CM | POA: Diagnosis present

## 2011-11-02 DIAGNOSIS — Z79899 Other long term (current) drug therapy: Secondary | ICD-10-CM

## 2011-11-02 DIAGNOSIS — Z8719 Personal history of other diseases of the digestive system: Secondary | ICD-10-CM

## 2011-11-02 DIAGNOSIS — Z96659 Presence of unspecified artificial knee joint: Secondary | ICD-10-CM

## 2011-11-02 DIAGNOSIS — E871 Hypo-osmolality and hyponatremia: Secondary | ICD-10-CM | POA: Diagnosis not present

## 2011-11-02 DIAGNOSIS — M1710 Unilateral primary osteoarthritis, unspecified knee: Principal | ICD-10-CM | POA: Diagnosis present

## 2011-11-02 HISTORY — PX: TOTAL KNEE ARTHROPLASTY: SHX125

## 2011-11-02 LAB — TYPE AND SCREEN: ABO/RH(D): A POS

## 2011-11-02 LAB — ABO/RH: ABO/RH(D): A POS

## 2011-11-02 SURGERY — ARTHROPLASTY, KNEE, TOTAL
Anesthesia: Spinal | Site: Knee | Laterality: Left | Wound class: Clean

## 2011-11-02 MED ORDER — FENTANYL CITRATE 0.05 MG/ML IJ SOLN
INTRAMUSCULAR | Status: DC | PRN
  Administered 2011-11-02 (×2): 50 ug via INTRAVENOUS

## 2011-11-02 MED ORDER — PROPRANOLOL HCL ER 60 MG PO CP24
60.0000 mg | ORAL_CAPSULE | Freq: Every evening | ORAL | Status: DC
  Administered 2011-11-02 – 2011-11-03 (×2): 60 mg via ORAL
  Filled 2011-11-02 (×3): qty 1

## 2011-11-02 MED ORDER — DIPHENHYDRAMINE HCL 12.5 MG/5ML PO ELIX
12.5000 mg | ORAL_SOLUTION | ORAL | Status: DC | PRN
  Administered 2011-11-02: 12.5 mg via ORAL
  Filled 2011-11-02: qty 5

## 2011-11-02 MED ORDER — FLEET ENEMA 7-19 GM/118ML RE ENEM: 1.0000 | ENEMA | Freq: Once | RECTAL | Status: AC | PRN

## 2011-11-02 MED ORDER — MORPHINE SULFATE (PF) 1 MG/ML IV SOLN
INTRAVENOUS | Status: AC
  Filled 2011-11-02: qty 25

## 2011-11-02 MED ORDER — DEXAMETHASONE SODIUM PHOSPHATE 10 MG/ML IJ SOLN
10.0000 mg | Freq: Once | INTRAMUSCULAR | Status: DC
  Filled 2011-11-02: qty 1

## 2011-11-02 MED ORDER — ACETAMINOPHEN 10 MG/ML IV SOLN
1000.0000 mg | Freq: Once | INTRAVENOUS | Status: AC
  Administered 2011-11-02: 1000 mg via INTRAVENOUS

## 2011-11-02 MED ORDER — PSYLLIUM 95 % PO PACK
1.0000 | PACK | Freq: Every day | ORAL | Status: DC
  Administered 2011-11-02 – 2011-11-04 (×3): 1 via ORAL
  Filled 2011-11-02 (×3): qty 1

## 2011-11-02 MED ORDER — TEMAZEPAM 15 MG PO CAPS: 15.0000 mg | ORAL_CAPSULE | Freq: Every evening | ORAL | Status: DC | PRN

## 2011-11-02 MED ORDER — ACETAMINOPHEN 10 MG/ML IV SOLN
1000.0000 mg | Freq: Four times a day (QID) | INTRAVENOUS | Status: AC
  Administered 2011-11-02 – 2011-11-03 (×4): 1000 mg via INTRAVENOUS
  Filled 2011-11-02 (×5): qty 100

## 2011-11-02 MED ORDER — LIDOCAINE HCL (CARDIAC) 20 MG/ML IV SOLN
INTRAVENOUS | Status: DC | PRN
  Administered 2011-11-02: 100 mg via INTRAVENOUS

## 2011-11-02 MED ORDER — METOCLOPRAMIDE HCL 5 MG/ML IJ SOLN: 5.0000 mg | Freq: Three times a day (TID) | INTRAMUSCULAR | Status: DC | PRN

## 2011-11-02 MED ORDER — OXYCODONE HCL 5 MG PO TABS
5.0000 mg | ORAL_TABLET | ORAL | Status: DC | PRN
  Administered 2011-11-02 (×2): 5 mg via ORAL
  Administered 2011-11-03 (×2): 10 mg via ORAL
  Administered 2011-11-03: 5 mg via ORAL
  Filled 2011-11-02: qty 1
  Filled 2011-11-02: qty 2
  Filled 2011-11-02 (×2): qty 1
  Filled 2011-11-02: qty 2

## 2011-11-02 MED ORDER — CEFAZOLIN SODIUM 1-5 GM-% IV SOLN
1.0000 g | Freq: Four times a day (QID) | INTRAVENOUS | Status: AC
  Administered 2011-11-02 (×2): 1 g via INTRAVENOUS
  Filled 2011-11-02 (×2): qty 50

## 2011-11-02 MED ORDER — ACETAMINOPHEN 650 MG RE SUPP: 650.0000 mg | Freq: Four times a day (QID) | RECTAL | Status: DC | PRN

## 2011-11-02 MED ORDER — BUPIVACAINE 0.25 % ON-Q PUMP SINGLE CATH 300ML
INJECTION | Status: DC | PRN
  Administered 2011-11-02: 300 mL

## 2011-11-02 MED ORDER — CEFAZOLIN SODIUM-DEXTROSE 2-3 GM-% IV SOLR
INTRAVENOUS | Status: AC
  Filled 2011-11-02: qty 50

## 2011-11-02 MED ORDER — PHENOL 1.4 % MT LIQD: 1.0000 | OROMUCOSAL | Status: DC | PRN

## 2011-11-02 MED ORDER — ONDANSETRON HCL 4 MG/2ML IJ SOLN
INTRAMUSCULAR | Status: DC | PRN
  Administered 2011-11-02: 4 mg via INTRAVENOUS

## 2011-11-02 MED ORDER — MORPHINE SULFATE (PF) 1 MG/ML IV SOLN
INTRAVENOUS | Status: DC
  Administered 2011-11-02: 23:00:00 via INTRAVENOUS
  Administered 2011-11-02: 1.7 mg via INTRAVENOUS
  Administered 2011-11-02: 1 mg via INTRAVENOUS
  Administered 2011-11-02: 8 mg via INTRAVENOUS
  Filled 2011-11-02: qty 25

## 2011-11-02 MED ORDER — HYDROMORPHONE HCL PF 1 MG/ML IJ SOLN: 0.2500 mg | INTRAMUSCULAR | Status: DC | PRN

## 2011-11-02 MED ORDER — CEFAZOLIN SODIUM-DEXTROSE 2-3 GM-% IV SOLR
2.0000 g | INTRAVENOUS | Status: AC
  Administered 2011-11-02: 2 g via INTRAVENOUS

## 2011-11-02 MED ORDER — NALOXONE HCL 0.4 MG/ML IJ SOLN: 0.4000 mg | INTRAMUSCULAR | Status: DC | PRN

## 2011-11-02 MED ORDER — ONDANSETRON HCL 4 MG/2ML IJ SOLN
4.0000 mg | Freq: Four times a day (QID) | INTRAMUSCULAR | Status: DC | PRN
  Administered 2011-11-03: 4 mg via INTRAVENOUS

## 2011-11-02 MED ORDER — MIDAZOLAM HCL 5 MG/5ML IJ SOLN
INTRAMUSCULAR | Status: DC | PRN
  Administered 2011-11-02: 2 mg via INTRAVENOUS

## 2011-11-02 MED ORDER — BUPIVACAINE IN DEXTROSE 0.75-8.25 % IT SOLN
INTRATHECAL | Status: DC | PRN
  Administered 2011-11-02: 10 mg via INTRATHECAL

## 2011-11-02 MED ORDER — ACETAMINOPHEN 325 MG PO TABS
650.0000 mg | ORAL_TABLET | Freq: Four times a day (QID) | ORAL | Status: DC | PRN
  Administered 2011-11-04: 650 mg via ORAL
  Filled 2011-11-02: qty 2

## 2011-11-02 MED ORDER — MENTHOL 3 MG MT LOZG: 1.0000 | LOZENGE | OROMUCOSAL | Status: DC | PRN

## 2011-11-02 MED ORDER — SODIUM CHLORIDE 0.9 % IV SOLN: INTRAVENOUS | Status: DC

## 2011-11-02 MED ORDER — DIPHENHYDRAMINE HCL 50 MG/ML IJ SOLN: 12.5000 mg | Freq: Four times a day (QID) | INTRAMUSCULAR | Status: DC | PRN

## 2011-11-02 MED ORDER — DIPHENHYDRAMINE HCL 12.5 MG/5ML PO ELIX: 12.5000 mg | ORAL_SOLUTION | Freq: Four times a day (QID) | ORAL | Status: DC | PRN

## 2011-11-02 MED ORDER — METOCLOPRAMIDE HCL 10 MG PO TABS: 5.0000 mg | ORAL_TABLET | Freq: Three times a day (TID) | ORAL | Status: DC | PRN

## 2011-11-02 MED ORDER — ONDANSETRON HCL 4 MG/2ML IJ SOLN
4.0000 mg | Freq: Four times a day (QID) | INTRAMUSCULAR | Status: DC | PRN
  Filled 2011-11-02: qty 2

## 2011-11-02 MED ORDER — BISACODYL 10 MG RE SUPP
10.0000 mg | Freq: Every day | RECTAL | Status: DC | PRN
  Administered 2011-11-03: 10 mg via RECTAL
  Filled 2011-11-02: qty 1

## 2011-11-02 MED ORDER — POLYETHYLENE GLYCOL 3350 17 G PO PACK: 17.0000 g | PACK | Freq: Every day | ORAL | Status: DC | PRN

## 2011-11-02 MED ORDER — LACTATED RINGERS IV SOLN
INTRAVENOUS | Status: DC
  Administered 2011-11-02: 12:00:00 via INTRAVENOUS
  Administered 2011-11-02: 1000 mL via INTRAVENOUS

## 2011-11-02 MED ORDER — METHOCARBAMOL 500 MG PO TABS
500.0000 mg | ORAL_TABLET | Freq: Four times a day (QID) | ORAL | Status: DC | PRN
  Administered 2011-11-04: 500 mg via ORAL
  Filled 2011-11-02: qty 1

## 2011-11-02 MED ORDER — BUPIVACAINE ON-Q PAIN PUMP (FOR ORDER SET NO CHG)
INJECTION | Status: DC
  Filled 2011-11-02: qty 1

## 2011-11-02 MED ORDER — DOCUSATE SODIUM 100 MG PO CAPS
100.0000 mg | ORAL_CAPSULE | Freq: Two times a day (BID) | ORAL | Status: DC
  Administered 2011-11-02 – 2011-11-04 (×4): 100 mg via ORAL

## 2011-11-02 MED ORDER — ONDANSETRON HCL 4 MG PO TABS: 4.0000 mg | ORAL_TABLET | Freq: Four times a day (QID) | ORAL | Status: DC | PRN

## 2011-11-02 MED ORDER — BUPIVACAINE 0.25 % ON-Q PUMP SINGLE CATH 300ML
INJECTION | Status: AC
  Filled 2011-11-02: qty 300

## 2011-11-02 MED ORDER — TRAMADOL HCL 50 MG PO TABS
50.0000 mg | ORAL_TABLET | Freq: Four times a day (QID) | ORAL | Status: DC | PRN
  Administered 2011-11-03 (×2): 50 mg via ORAL
  Administered 2011-11-04: 100 mg via ORAL
  Administered 2011-11-04: 50 mg via ORAL
  Filled 2011-11-02 (×2): qty 1
  Filled 2011-11-02: qty 2
  Filled 2011-11-02: qty 1

## 2011-11-02 MED ORDER — RIVAROXABAN 10 MG PO TABS
10.0000 mg | ORAL_TABLET | Freq: Every day | ORAL | Status: DC
  Administered 2011-11-03 – 2011-11-04 (×2): 10 mg via ORAL
  Filled 2011-11-02 (×4): qty 1

## 2011-11-02 MED ORDER — KCL IN DEXTROSE-NACL 20-5-0.9 MEQ/L-%-% IV SOLN
INTRAVENOUS | Status: DC
  Administered 2011-11-02 – 2011-11-03 (×2): via INTRAVENOUS
  Filled 2011-11-02 (×3): qty 1000

## 2011-11-02 MED ORDER — BUPIVACAINE 0.25 % ON-Q PUMP SINGLE CATH 300ML: 300.0000 mL | INJECTION | Status: DC

## 2011-11-02 MED ORDER — SODIUM CHLORIDE 0.9 % IJ SOLN: 9.0000 mL | INTRAMUSCULAR | Status: DC | PRN

## 2011-11-02 MED ORDER — EPHEDRINE SULFATE 50 MG/ML IJ SOLN
INTRAMUSCULAR | Status: DC | PRN
  Administered 2011-11-02: 5 mg via INTRAVENOUS
  Administered 2011-11-02: 10 mg via INTRAVENOUS

## 2011-11-02 MED ORDER — METHOCARBAMOL 100 MG/ML IJ SOLN
500.0000 mg | Freq: Four times a day (QID) | INTRAVENOUS | Status: DC | PRN
  Filled 2011-11-02: qty 5

## 2011-11-02 MED ORDER — 0.9 % SODIUM CHLORIDE (POUR BTL) OPTIME
TOPICAL | Status: DC | PRN
  Administered 2011-11-02: 1000 mL

## 2011-11-02 MED ORDER — ACETAMINOPHEN 10 MG/ML IV SOLN
INTRAVENOUS | Status: AC
  Filled 2011-11-02: qty 100

## 2011-11-02 MED ORDER — PROPOFOL 10 MG/ML IV EMUL
INTRAVENOUS | Status: DC | PRN
  Administered 2011-11-02: 75 ug/kg/min via INTRAVENOUS

## 2011-11-02 SURGICAL SUPPLY — 52 items
BAG ZIPLOCK 12X15 (MISCELLANEOUS) ×2 IMPLANT
BANDAGE ELASTIC 6 VELCRO ST LF (GAUZE/BANDAGES/DRESSINGS) ×2 IMPLANT
BANDAGE ESMARK 6X9 LF (GAUZE/BANDAGES/DRESSINGS) ×1 IMPLANT
BLADE SAG 18X100X1.27 (BLADE) ×2 IMPLANT
BLADE SAW SGTL 11.0X1.19X90.0M (BLADE) ×2 IMPLANT
BNDG ESMARK 6X9 LF (GAUZE/BANDAGES/DRESSINGS) ×2
BOWL SMART MIX CTS (DISPOSABLE) ×2 IMPLANT
CATH KIT ON-Q SILVERSOAK 5IN (CATHETERS) ×2 IMPLANT
CEMENT HV SMART SET (Cement) ×4 IMPLANT
CLOTH BEACON ORANGE TIMEOUT ST (SAFETY) ×2 IMPLANT
CUFF TOURN SGL QUICK 34 (TOURNIQUET CUFF) ×1
CUFF TRNQT CYL 34X4X40X1 (TOURNIQUET CUFF) ×1 IMPLANT
DRAPE EXTREMITY T 121X128X90 (DRAPE) ×2 IMPLANT
DRAPE POUCH INSTRU U-SHP 10X18 (DRAPES) ×2 IMPLANT
DRAPE U-SHAPE 47X51 STRL (DRAPES) ×2 IMPLANT
DRSG ADAPTIC 3X8 NADH LF (GAUZE/BANDAGES/DRESSINGS) ×2 IMPLANT
DRSG PAD ABDOMINAL 8X10 ST (GAUZE/BANDAGES/DRESSINGS) ×2 IMPLANT
DRSG TEGADERM 4X4.75 (GAUZE/BANDAGES/DRESSINGS) ×2 IMPLANT
DURAPREP 26ML APPLICATOR (WOUND CARE) ×2 IMPLANT
ELECT REM PT RETURN 9FT ADLT (ELECTROSURGICAL) ×2
ELECTRODE REM PT RTRN 9FT ADLT (ELECTROSURGICAL) ×1 IMPLANT
EVACUATOR 1/8 PVC DRAIN (DRAIN) ×2 IMPLANT
FACESHIELD LNG OPTICON STERILE (SAFETY) ×10 IMPLANT
GLOVE BIO SURGEON STRL SZ7.5 (GLOVE) ×2 IMPLANT
GLOVE BIO SURGEON STRL SZ8 (GLOVE) ×2 IMPLANT
GLOVE BIOGEL PI IND STRL 8 (GLOVE) ×2 IMPLANT
GLOVE BIOGEL PI INDICATOR 8 (GLOVE) ×2
GOWN STRL NON-REIN LRG LVL3 (GOWN DISPOSABLE) ×2 IMPLANT
GOWN STRL REIN XL XLG (GOWN DISPOSABLE) ×2 IMPLANT
HANDPIECE INTERPULSE COAX TIP (DISPOSABLE) ×1
IMMOBILIZER KNEE 20 (SOFTGOODS) ×2
IMMOBILIZER KNEE 20 THIGH 36 (SOFTGOODS) ×1 IMPLANT
KIT BASIN OR (CUSTOM PROCEDURE TRAY) ×2 IMPLANT
MANIFOLD NEPTUNE II (INSTRUMENTS) ×2 IMPLANT
NS IRRIG 1000ML POUR BTL (IV SOLUTION) ×2 IMPLANT
PACK TOTAL JOINT (CUSTOM PROCEDURE TRAY) ×2 IMPLANT
PAD ABD 7.5X8 STRL (GAUZE/BANDAGES/DRESSINGS) ×2 IMPLANT
PADDING CAST COTTON 6X4 STRL (CAST SUPPLIES) ×2 IMPLANT
POSITIONER SURGICAL ARM (MISCELLANEOUS) ×2 IMPLANT
SET HNDPC FAN SPRY TIP SCT (DISPOSABLE) ×1 IMPLANT
SPONGE GAUZE 4X4 12PLY (GAUZE/BANDAGES/DRESSINGS) ×2 IMPLANT
STRIP CLOSURE SKIN 1/2X4 (GAUZE/BANDAGES/DRESSINGS) ×4 IMPLANT
SUCTION FRAZIER 12FR DISP (SUCTIONS) ×2 IMPLANT
SUT MNCRL AB 4-0 PS2 18 (SUTURE) ×2 IMPLANT
SUT PDS AB 1 CT1 27 (SUTURE) ×6 IMPLANT
SUT VIC AB 2-0 CT1 27 (SUTURE) ×3
SUT VIC AB 2-0 CT1 TAPERPNT 27 (SUTURE) ×3 IMPLANT
SUT VLOC 180 0 24IN GS25 (SUTURE) ×2 IMPLANT
TOWEL OR 17X26 10 PK STRL BLUE (TOWEL DISPOSABLE) ×4 IMPLANT
TRAY FOLEY CATH 14FRSI W/METER (CATHETERS) ×2 IMPLANT
WATER STERILE IRR 1500ML POUR (IV SOLUTION) ×2 IMPLANT
WRAP KNEE MAXI GEL POST OP (GAUZE/BANDAGES/DRESSINGS) ×4 IMPLANT

## 2011-11-02 NOTE — Preoperative (Signed)
Beta Blockers   Reason not to administer Beta Blockers:inderal 60 mg taken 11-01-11 at 2100

## 2011-11-02 NOTE — Interval H&P Note (Signed)
History and Physical Interval Note:  11/02/2011 11:09 AM  Danielle Nicholson  has presented today for surgery, with the diagnosis of Osteoarthritis of the Left Knee  The various methods of treatment have been discussed with the patient and family. After consideration of risks, benefits and other options for treatment, the patient has consented to  Procedure(s) (LRB): TOTAL KNEE ARTHROPLASTY (Left) as a surgical intervention .  The patient's history has been reviewed, patient examined, no change in status, stable for surgery.  I have reviewed the patient's chart and labs.  Questions were answered to the patient's satisfaction.     Loanne Drilling

## 2011-11-02 NOTE — Anesthesia Preprocedure Evaluation (Addendum)
Anesthesia Evaluation  Patient identified by MRN, date of birth, ID band Patient awake    Reviewed: Allergy & Precautions, H&P , NPO status , Patient's Chart, lab work & pertinent test results, reviewed documented beta blocker date and time   Airway Mallampati: II TM Distance: >3 FB Neck ROM: Full    Dental  (+) Teeth Intact and Dental Advisory Given   Pulmonary  Hx of sarcoidosis breath sounds clear to auscultation        Cardiovascular Rhythm:Regular Rate:Normal  Hx palpitation/neg Holter/inderal Rx/ good control   Neuro/Psych negative neurological ROS  negative psych ROS   GI/Hepatic negative GI ROS, Neg liver ROS,   Endo/Other  negative endocrine ROSThyroid nodule  Renal/GU negative Renal ROS  negative genitourinary   Musculoskeletal   Abdominal   Peds negative pediatric ROS (+)  Hematology negative hematology ROS (+)   Anesthesia Other Findings   Reproductive/Obstetrics negative OB ROS                           Anesthesia Physical Anesthesia Plan  ASA: II  Anesthesia Plan: Spinal   Post-op Pain Management:    Induction: Intravenous  Airway Management Planned: Mask  Additional Equipment:   Intra-op Plan:   Post-operative Plan:   Informed Consent: I have reviewed the patients History and Physical, chart, labs and discussed the procedure including the risks, benefits and alternatives for the proposed anesthesia with the patient or authorized representative who has indicated his/her understanding and acceptance.   Dental advisory given  Plan Discussed with: CRNA and Surgeon  Anesthesia Plan Comments:         Anesthesia Quick Evaluation

## 2011-11-02 NOTE — Progress Notes (Signed)
Utilization review completed.  

## 2011-11-02 NOTE — Anesthesia Procedure Notes (Signed)
Spinal  Patient location during procedure: OR Start time: 11/02/2011 11:17 AM End time: 11/02/2011 11:27 AM Staffing Anesthesiologist: Lestine Box B Performed by: anesthesiologist  Preanesthetic Checklist Completed: patient identified, site marked, surgical consent, pre-op evaluation, timeout performed, IV checked, risks and benefits discussed and monitors and equipment checked Spinal Block Patient position: sitting Prep: Betadine and site prepped and draped Patient monitoring: heart rate, cardiac monitor, continuous pulse ox and blood pressure Approach: right paramedian Location: L3-4 Injection technique: single-shot Needle Needle type: Quincke  Needle length: 9 cm Needle insertion depth: 3 cm Assessment Sensory level: T6 Additional Notes [id number], 2014-09

## 2011-11-02 NOTE — Anesthesia Postprocedure Evaluation (Signed)
  Anesthesia Post-op Note  Patient: Danielle Nicholson  Procedure(s) Performed: Procedure(s) (LRB): TOTAL KNEE ARTHROPLASTY (Left)  Patient Location: PACU  Anesthesia Type: Spinal  Level of Consciousness: oriented and sedated  Airway and Oxygen Therapy: Patient Spontanous Breathing and Patient connected to nasal cannula oxygen  Post-op Pain: mild  Post-op Assessment: Post-op Vital signs reviewed, Patient's Cardiovascular Status Stable, Respiratory Function Stable and Patent Airway  Post-op Vital Signs: stable  Complications: No apparent anesthesia complications

## 2011-11-02 NOTE — Op Note (Signed)
Pre-operative diagnosis- Osteoarthritis  Left knee(s)  Post-operative diagnosis- Osteoarthritis Left knee(s)  Procedure-  Left  Total Knee Arthroplasty  Surgeon- Gus Rankin. Hasel Janish, MD  Assistant- Tech assist   Anesthesia-  Spinal EBL-* No blood loss amount entered *  Drains Hemovac  Tourniquet time-  Total Tourniquet Time Documented: Thigh (Left) - 41 minutes   Complications- None  Condition-PACU - hemodynamically stable.   Brief Clinical Note  Antoninette Lerner is a 66 y.o. year old female with end stage OA of her left knee with progressively worsening pain and dysfunction. She has constant pain, with activity and at rest and significant functional deficits with difficulties even with ADLs. She has had extensive non-op management including analgesics, injections of cortisone and viscosupplements, and home exercise program, but remains in significant pain with significant dysfunction. Radiographs show bone on bone arthritis medial and patellofemoral. She presents now for left Total Knee Arthroplasty.    Procedure in detail---   The patient is brought into the operating room and positioned supine on the operating table. After successful administration of  Spinal,   a tourniquet is placed high on the  Left thigh(s) and the lower extremity is prepped and draped in the usual sterile fashion. Time out is performed by the operating team and then the  Left lower extremity is wrapped in Esmarch, knee flexed and the tourniquet inflated to 300 mmHg.       A midline incision is made with a ten blade through the subcutaneous tissue to the level of the extensor mechanism. A fresh blade is used to make a medial parapatellar arthrotomy. Soft tissue over the proximal medial tibia is subperiosteally elevated to the joint line with a knife and into the semimembranosus bursa with a Cobb elevator. Soft tissue over the proximal lateral tibia is elevated with attention being paid to avoiding the patellar tendon on the  tibial tubercle. The patella is everted, knee flexed 90 degrees and the ACL and PCL are removed. Findings are bone on bone medial and patellofemoral with varus deformity.        The drill is used to create a starting hole in the distal femur and the canal is thoroughly irrigated with sterile saline to remove the fatty contents. The 5 degree Left  valgus alignment guide is placed into the femoral canal and the distal femoral cutting block is pinned to remove 10 mm off the distal femur. Resection is made with an oscillating saw.      The tibia is subluxed forward and the menisci are removed. The extramedullary alignment guide is placed referencing proximally at the medial aspect of the tibial tubercle and distally along the second metatarsal axis and tibial crest. The block is pinned to remove 2mm off the more deficient medial  side. Resection is made with an oscillating saw. Size 3is the most appropriate size for the tibia and the proximal tibia is prepared with the modular drill and keel punch for that size.      The femoral sizing guide is placed and size 4 narrow is most appropriate. Rotation is marked off the epicondylar axis and confirmed by creating a rectangular flexion gap at 90 degrees. The size 4 cutting block is pinned in this rotation and the anterior, posterior and chamfer cuts are made with the oscillating saw. The intercondylar block is then placed and that cut is made.      Trial size 3 tibial component, trial size 4 narrow posterior stabilized femur and a 12.5  mm  posterior stabilized rotating platform insert trial is placed. Full extension is achieved with excellent varus/valgus and anterior/posterior balance throughout full range of motion. The patella is everted and thickness measured to be 24  mm. Free hand resection is taken to 14 mm, a 35 template is placed, lug holes are drilled, trial patella is placed, and it tracks normally. Osteophytes are removed off the posterior femur with the trial  in place. All trials are removed and the cut bone surfaces prepared with pulsatile lavage. Cement is mixed and once ready for implantation, the size 3 tibial implant, size  4 narrow posterior stabilized femoral component, and the size 35 patella are cemented in place and the patella is held with the clamp. The trial insert is placed and the knee held in full extension. All extruded cement is removed and once the cement is hard the permanent 12.5 mm posterior stabilized rotating platform insert is placed into the tibial tray.      The wound is copiously irrigated with saline solution and the extensor mechanism closed over a hemovac drain with #1 PDS suture. The tourniquet is released for a total tourniquet time of 41  minutes. Flexion against gravity is 135 degrees and the patella tracks normally. Subcutaneous tissue is closed with 2.0 vicryl and subcuticular with running 4.0 Monocryl. The catheter for the Marcaine pain pump is placed and the pump is initiated. The incision is cleaned and dried and steri-strips and a bulky sterile dressing are applied. The limb is placed into a knee immobilizer and the patient is awakened and transported to recovery in stable condition.      Please note that a surgical assistant was a medical necessity for this procedure in order to perform it in a safe and expeditious manner. Surgical assistant was necessary to retract the ligaments and vital neurovascular structures to prevent injury to them and also necessary for proper positioning of the limb to allow for anatomic placement of the prosthesis.   Gus Rankin Eustace Hur, MD    11/02/2011, 1:04 PM

## 2011-11-02 NOTE — Transfer of Care (Signed)
Immediate Anesthesia Transfer of Care Note  Patient: Danielle Nicholson  Procedure(s) Performed: Procedure(s) (LRB): TOTAL KNEE ARTHROPLASTY (Left)  Patient Location: PACU  Anesthesia Type: MAC and Spinal  Level of Consciousness: awake, alert , oriented and patient cooperative  Airway & Oxygen Therapy: Patient Spontanous Breathing and Patient connected to face mask oxygen  Post-op Assessment: Report given to PACU RN and Post -op Vital signs reviewed and stable  Post vital signs: Reviewed and stable  Complications: No apparent anesthesia complications

## 2011-11-03 ENCOUNTER — Encounter (HOSPITAL_COMMUNITY): Payer: Self-pay | Admitting: Orthopedic Surgery

## 2011-11-03 LAB — BASIC METABOLIC PANEL
CO2: 27 mEq/L (ref 19–32)
Calcium: 8.4 mg/dL (ref 8.4–10.5)
Chloride: 103 mEq/L (ref 96–112)
Creatinine, Ser: 0.74 mg/dL (ref 0.50–1.10)
Glucose, Bld: 111 mg/dL — ABNORMAL HIGH (ref 70–99)
Sodium: 134 mEq/L — ABNORMAL LOW (ref 135–145)

## 2011-11-03 LAB — CBC
HCT: 33.5 % — ABNORMAL LOW (ref 36.0–46.0)
Hemoglobin: 10.9 g/dL — ABNORMAL LOW (ref 12.0–15.0)
MCH: 31.1 pg (ref 26.0–34.0)
MCHC: 32.5 g/dL (ref 30.0–36.0)
MCV: 95.4 fL (ref 78.0–100.0)
Platelets: 210 10*3/uL (ref 150–400)
RBC: 3.51 MIL/uL — ABNORMAL LOW (ref 3.87–5.11)
RDW: 12.4 % (ref 11.5–15.5)
WBC: 8.9 10*3/uL (ref 4.0–10.5)

## 2011-11-03 MED ORDER — MORPHINE SULFATE 2 MG/ML IJ SOLN: 1.0000 mg | INTRAMUSCULAR | Status: DC | PRN

## 2011-11-03 MED ORDER — SODIUM CHLORIDE 0.9 % IV BOLUS (SEPSIS)
500.0000 mL | Freq: Once | INTRAVENOUS | Status: AC
  Administered 2011-11-03: 500 mL via INTRAVENOUS

## 2011-11-03 NOTE — Evaluation (Signed)
Occupational Therapy Evaluation Patient Details Name: Danielle Nicholson MRN: [id number] DOB: [birth year] Today's Date: 11/03/2011 Time: 2130-8657 OT Time Calculation (min): 30 min  OT Assessment / Plan / Recommendation Clinical Impression  Pt doing well POD 1 LTKR. Skilled OT indicated to maximize independence with BADLs to supervision level in prep for d/c home with prn A from family.    OT Assessment  Patient needs continued OT Services    Follow Up Recommendations  No OT follow up    Barriers to Discharge      Equipment Recommendations  Rolling walker with 5" wheels    Recommendations for Other Services    Frequency  Min 2X/week    Precautions / Restrictions Precautions Precautions: Knee Required Braces or Orthoses: Knee Immobilizer - Left Knee Immobilizer - Left: Discontinue once straight leg raise with < 10 degree lag Restrictions Weight Bearing Restrictions: No Other Position/Activity Restrictions: WBAT   Pertinent Vitals/Pain     ADL  Grooming: Performed;Wash/dry hands;Min guard Where Assessed - Grooming: Supported standing Upper Body Bathing: Simulated;Set up Where Assessed - Upper Body Bathing: Unsupported sitting Lower Body Bathing: Simulated;Minimal assistance Where Assessed - Lower Body Bathing: Supported sit to stand Upper Body Dressing: Simulated;Set up Where Assessed - Upper Body Dressing: Unsupported sitting Lower Body Dressing: Simulated Where Assessed - Lower Body Dressing: Supported sit to stand Toilet Transfer: Performed;Minimal assistance Toilet Transfer Method: Sit to Barista: Raised toilet seat with arms (or 3-in-1 over toilet) Toileting - Clothing Manipulation and Hygiene: Performed;Min guard Where Assessed - Glass blower/designer Manipulation and Hygiene: Sit to stand from 3-in-1 or toilet Equipment Used: Rolling walker;Gait belt Transfers/Ambulation Related to ADLs: Pt ambulated to the bathroom with minguard A and min VCs for  step sequence and safely manipulating RW in the bathroom.    OT Diagnosis: Generalized weakness  OT Problem List: Decreased activity tolerance;Decreased knowledge of use of DME or AE OT Treatment Interventions: Self-care/ADL training;Therapeutic activities;DME and/or AE instruction;Patient/family education   OT Goals Acute Rehab OT Goals OT Goal Formulation: With patient Time For Goal Achievement: 11/10/11 Potential to Achieve Goals: Good ADL Goals Pt Will Perform Grooming: with supervision;Standing at sink ADL Goal: Grooming - Progress: Goal set today Pt Will Perform Lower Body Bathing: with supervision;Sit to stand from chair;Sit to stand from bed ADL Goal: Lower Body Bathing - Progress: Goal set today Pt Will Perform Lower Body Dressing: with supervision;Sit to stand from chair;Sit to stand from bed ADL Goal: Lower Body Dressing - Progress: Goal set today Pt Will Transfer to Toilet: with supervision;Comfort height toilet;3-in-1 ADL Goal: Toilet Transfer - Progress: Goal set today Pt Will Perform Toileting - Clothing Manipulation: with supervision;Standing ADL Goal: Toileting - Clothing Manipulation - Progress: Goal set today Pt Will Perform Toileting - Hygiene: with supervision;Sit to stand from 3-in-1/toilet ADL Goal: Toileting - Hygiene - Progress: Goal set today Pt Will Perform Tub/Shower Transfer: with supervision;Ambulation ADL Goal: Tub/Shower Transfer - Progress: Goal set today  Visit Information  Last OT Received On: 11/03/11 Assistance Needed: +1    Subjective Data  Subjective: I feel like I need to be walking quicker. Patient Stated Goal: Do everything I was doing before.   Prior Functioning  Vision/Perception  Home Living Lives With: Spouse Available Help at Discharge: Family;Friend(s) Type of Home: House Home Access: Stairs to enter [employer] of Steps: 3 Entrance Stairs-Rails: Right;Left Home Layout: Two level;Able to live on main level with  bedroom/bathroom Bathroom Shower/Tub: Health visitor: Standard Home Adaptive Equipment: Bedside  commode/3-in-1 Prior Function Level of Independence: Independent Able to Take Stairs?: Yes Driving: Yes Vocation: Retired Musician: No difficulties Dominant Hand: Right      Cognition  Overall Cognitive Status: Appears within functional limits for tasks assessed/performed Arousal/Alertness: Awake/alert Orientation Level: Appears intact for tasks assessed Behavior During Session: [facility] for tasks performed    Extremity/Trunk Assessment Right Upper Extremity Assessment RUE ROM/Strength/Tone: [facility] for tasks assessed Left Upper Extremity Assessment LUE ROM/Strength/Tone: WFL for tasks assessed    Mobility Bed Mobility Bed Mobility: Supine to Sit Supine to Sit: 4: Min assist Details for Bed Mobility Assistance: cues for sequence and use of R LE to self assist Transfers Transfers: Sit to Stand;Stand to Sit Sit to Stand: 4: Min assist;With upper extremity assist;With armrests;From chair/3-in-1 Stand to Sit: 4: Min guard;With upper extremity assist;To chair/3-in-1;With armrests Details for Transfer Assistance: Min VCs for hand placement and LLE management.   Exercise   Balance    End of Session OT - End of Session Equipment Utilized During Treatment: Gait belt Activity Tolerance: Patient tolerated treatment well Patient left: in chair;with call bell/phone within reach  GO     Tobin Cadiente A OTR/L [phone] 11/03/2011, 1:20 PM

## 2011-11-03 NOTE — Evaluation (Signed)
Physical Therapy Evaluation Patient Details Name: Danielle Nicholson MRN: [id number] DOB: December 11, 1945 Today's Date: 11/03/2011 Time: 0865-7846 PT Time Calculation (min): 38 min  PT Assessment / Plan / Recommendation Clinical Impression  Pt wtih L TKR presents with decreased L LE strength/ROM and limitations in functional mobility    PT Assessment  Patient needs continued PT services    Follow Up Recommendations  Home health PT    Barriers to Discharge        Equipment Recommendations  Rolling walker with 5" wheels    Recommendations for Other Services OT consult   Frequency 7X/week    Precautions / Restrictions Precautions Precautions: Knee Required Braces or Orthoses: Knee Immobilizer - Left Knee Immobilizer - Left: Discontinue once straight leg raise with < 10 degree lag Restrictions Weight Bearing Restrictions: No Other Position/Activity Restrictions: WBAT   Pertinent Vitals/Pain 5/10; premedicated, ice packs provided      Mobility  Bed Mobility Bed Mobility: Supine to Sit Supine to Sit: 4: Min assist Details for Bed Mobility Assistance: cues for sequence and use of R LE to self assist Transfers Transfers: Sit to Stand;Stand to Sit Sit to Stand: 4: Min assist;With upper extremity assist;With armrests;From chair/3-in-1 Stand to Sit: 4: Min guard;With upper extremity assist;To chair/3-in-1;With armrests Details for Transfer Assistance: Min VCs for hand placement and LLE management. Ambulation/Gait Ambulation/Gait Assistance: 4: Min assist Ambulation Distance (Feet): 34 Feet Assistive device: Rolling walker Ambulation/Gait Assistance Details: cues for posture, sequence and position from RW Gait Pattern: Step-to pattern    Exercises Total Joint Exercises Ankle Circles/Pumps: AROM;Both;10 reps;Supine Quad Sets: AROM;Both;10 reps;Supine Heel Slides: AAROM;10 reps;Left;Supine Straight Leg Raises: AAROM;10 reps;Left;Supine   PT Diagnosis: Difficulty walking  PT Problem  List: Decreased strength;Decreased range of motion;Decreased activity tolerance;Decreased mobility;Decreased knowledge of use of DME;Pain PT Treatment Interventions: DME instruction;Gait training;Stair training;Functional mobility training;Therapeutic activities;Therapeutic exercise;Patient/family education   PT Goals Acute Rehab PT Goals PT Goal Formulation: With patient Time For Goal Achievement: 11/08/11 Potential to Achieve Goals: Good Pt will go Supine/Side to Sit: with supervision PT Goal: Supine/Side to Sit - Progress: Goal set today Pt will go Sit to Supine/Side: with supervision PT Goal: Sit to Supine/Side - Progress: Goal set today Pt will go Sit to Stand: with supervision PT Goal: Sit to Stand - Progress: Goal set today Pt will go Stand to Sit: with supervision PT Goal: Stand to Sit - Progress: Goal set today Pt will Ambulate: 51 - 150 feet;with supervision;with rolling walker PT Goal: Ambulate - Progress: Goal set today Pt will Go Up / Down Stairs: 3-5 stairs;with min assist;with least restrictive assistive device PT Goal: Up/Down Stairs - Progress: Goal set today  Visit Information  Last PT Received On: 11/03/11 Assistance Needed: +1    Subjective Data  Subjective: I'm ready to move, I want to go home Patient Stated Goal: Resume previous lifestyle with decreased pain   Prior Functioning  Home Living Lives With: Spouse Available Help at Discharge: Family;Friend(s) Type of Home: House Home Access: Stairs to enter [employer] of Steps: 3 Entrance Stairs-Rails: Right;Left Home Layout: Two level;Able to live on main level with bedroom/bathroom Bathroom Shower/Tub: Health visitor: Standard Home Adaptive Equipment: Bedside commode/3-in-1 Prior Function Level of Independence: Independent Able to Take Stairs?: Yes Driving: Yes Vocation: Retired Musician: No difficulties Dominant Hand: Right    Cognition  Overall  Cognitive Status: Appears within functional limits for tasks assessed/performed Arousal/Alertness: Awake/alert Orientation Level: Appears intact for tasks assessed Behavior During Session: [facility] for  tasks performed    Extremity/Trunk Assessment Right Upper Extremity Assessment RUE ROM/Strength/Tone: [facility] for tasks assessed Left Upper Extremity Assessment LUE ROM/Strength/Tone: WFL for tasks assessed Right Lower Extremity Assessment RLE ROM/Strength/Tone: [facility] for tasks assessed Left Lower Extremity Assessment LLE ROM/Strength/Tone: Deficits LLE ROM/Strength/Tone Deficits: 2/5 quads with -10 - 70 AAROM at knee   Balance    End of Session PT - End of Session Equipment Utilized During Treatment: Left knee immobilizer Activity Tolerance: Patient tolerated treatment well Patient left: in chair;with call bell/phone within reach Nurse Communication: Mobility status  GP     Synia Douglass 11/03/2011, 1:18 PM

## 2011-11-03 NOTE — Progress Notes (Signed)
Low blood pressure 81/49.  P.A. notified.  NS bolus given. Hgb and Hct to be drawn. Patient is asymptomatic.

## 2011-11-03 NOTE — Progress Notes (Signed)
   Subjective: 1 Day Post-Op Procedure(s) (LRB): TOTAL KNEE ARTHROPLASTY (Left) Patient reports pain as mild and moderate.   Patient seen in rounds with Dr. Lequita Halt.  Danielle Nicholson had a rough night the first night but doing better this morning. Patient is having problems with pain in the knee, requiring pain medications We will start therapy today.  Plan is to go Home after hospital stay.  Objective: Vital signs in last 24 hours: Temp:  [95.7 F (35.4 C)-98.6 F (37 C)] 98.4 F (36.9 C) (08/01 0451) Pulse Rate:  [53-71] 69  (08/01 0451) Resp:  [9-20] 16  (08/01 0451) BP: (81-115)/(49-71) 96/66 mmHg (08/01 0451) SpO2:  [97 %-100 %] 98 % (08/01 0442) Weight:  [81.647 kg (180 lb)] 81.647 kg (180 lb) (07/31 1422)  Intake/Output from previous day:  Intake/Output Summary (Last 24 hours) at 11/03/11 0928 Last data filed at 11/03/11 0612  Gross per 24 hour  Intake 3468.2 ml  Output   2770 ml  Net  698.2 ml    Intake/Output this shift:    Labs:  Basename 11/03/11 0350  HGB 10.9*    Basename 11/03/11 0350  WBC 8.9  RBC 3.51*  HCT 33.5*  PLT 210    Basename 11/03/11 0350  NA 134*  K 4.1  CL 103  CO2 27  BUN 12  CREATININE 0.74  GLUCOSE 111*  CALCIUM 8.4   No results found for this basename: LABPT:2,INR:2 in the last 72 hours  EXAM General - Patient is Alert, Appropriate and Oriented Extremity - Neurovascular intact Sensation intact distally Dorsiflexion/Plantar flexion intact Dressing - dressing C/D/I Motor Function - intact, moving foot and toes well on exam.  Hemovac pulled without difficulty.  Past Medical History  Diagnosis Date  . Sarcoidosis 1973  . Diverticula of colon   . Palpitations     normal echocardiogram and 48 hour Holter monitor in 2012  . Dysrhythmia     a-fib, or pvc's  . Thyroid nodule     followed by Dr. Mary Sella at [facility]    Assessment/Plan: 1 Day Post-Op Procedure(s) (LRB): TOTAL KNEE ARTHROPLASTY (Left) Principal Problem:  *OA  (osteoarthritis) of knee   Advance diet Up with therapy Discharge home with home health  DVT Prophylaxis - Xarelto Weight-Bearing as tolerated to left leg No vaccines. D/C PCA Morphine, Change to IV push D/C O2 and Pulse OX and try on Room Air  PERKINS, [name] 11/03/2011, 9:28 AM

## 2011-11-03 NOTE — Progress Notes (Signed)
Physical Therapy Treatment Patient Details Name: Danielle Nicholson MRN: [id number] DOB: [birth year] Today's Date: 11/03/2011 Time: 7829-5621 PT Time Calculation (min): 19 min  PT Assessment / Plan / Recommendation Comments on Treatment Session       Follow Up Recommendations  Home health PT    Barriers to Discharge        Equipment Recommendations  Rolling walker with 5" wheels    Recommendations for Other Services OT consult  Frequency 7X/week   Plan Discharge plan remains appropriate    Precautions / Restrictions Precautions Precautions: Knee Required Braces or Orthoses: Knee Immobilizer - Left Knee Immobilizer - Left: Discontinue once straight leg raise with < 10 degree lag Restrictions Weight Bearing Restrictions: No Other Position/Activity Restrictions: WBAT   Pertinent Vitals/Pain 5/10; premedicated    Mobility  Bed Mobility Bed Mobility: Sit to Supine Sit to Supine: 4: Min assist Details for Bed Mobility Assistance: cues for sequence and use of R LE to self assist Transfers Transfers: Sit to Stand;Stand to Sit Sit to Stand: 4: Min assist;With upper extremity assist;With armrests;From chair/3-in-1 Stand to Sit: 4: Min assist;To bed;With upper extremity assist Details for Transfer Assistance: Min VCs for hand placement and LLE management. Ambulation/Gait Ambulation/Gait Assistance: 4: Min assist Ambulation Distance (Feet): 94 Feet Assistive device: Rolling walker Ambulation/Gait Assistance Details: cues for posture, sequence and position from RW Gait Pattern: Step-to pattern    Exercises     PT Diagnosis:    PT Problem List:   PT Treatment Interventions:     PT Goals Acute Rehab PT Goals PT Goal Formulation: With patient Time For Goal Achievement: 11/08/11 Potential to Achieve Goals: Good Pt will go Supine/Side to Sit: with supervision PT Goal: Supine/Side to Sit - Progress: Goal set today Pt will go Sit to Supine/Side: with supervision PT Goal: Sit to  Supine/Side - Progress: Goal set today Pt will go Sit to Stand: with supervision PT Goal: Sit to Stand - Progress: Progressing toward goal Pt will go Stand to Sit: with supervision PT Goal: Stand to Sit - Progress: Progressing toward goal Pt will Ambulate: 51 - 150 feet;with supervision;with rolling walker PT Goal: Ambulate - Progress: Progressing toward goal Pt will Go Up / Down Stairs: 3-5 stairs;with min assist;with least restrictive assistive device PT Goal: Up/Down Stairs - Progress: Goal set today  Visit Information  Last PT Received On: 11/03/11 Assistance Needed: +1    Subjective Data  Patient Stated Goal: Resume previous lifestyle with decreased pain   Cognition  Overall Cognitive Status: Appears within functional limits for tasks assessed/performed Arousal/Alertness: Awake/alert Orientation Level: Appears intact for tasks assessed Behavior During Session: [facility] for tasks performed    Balance     End of Session PT - End of Session Equipment Utilized During Treatment: Left knee immobilizer Activity Tolerance: Patient tolerated treatment well Patient left: in bed;with call bell/phone within reach;with family/visitor present Nurse Communication: Mobility status CPM Left Knee CPM Left Knee: On   GP     Alban Marucci 11/03/2011, 4:35 PM

## 2011-11-03 NOTE — Care Management Note (Unsigned)
    Page 1 of 2   11/03/2011     2:14:59 PM   CARE MANAGEMENT NOTE 11/03/2011  Patient:  Danielle Nicholson,Danielle Nicholson   Account Number:  [id number]  Date Initiated:  11/03/2011  Documentation initiated by:  Colleen Can  Subjective/Objective Assessment:   dx osteoaethritis knee-left; total knee replacemnt on day of admission     Action/Plan:   CM spoke with patient and family members. Plans are for patient to return to her home in [place] [facility] [place] where her spouse and daughter will be caregivers. She is requesting specific physical therapist who works for [employer] . Will need RW   Anticipated DC Date:  11/04/2011   Anticipated DC Plan:  HOME W HOME HEALTH SERVICES  In-house referral  NA      DC Planning Services  CM consult      [facility] Choice  HOME HEALTH  DURABLE MEDICAL EQUIPMENT   Choice offered to / List presented to:  C-1 Patient   DME arranged  Levan Hurst      DME agency  [facility]     HH arranged  HH-2 PT      [facility] agency  [facility]   Status of service:  In process, will continue to follow Medicare Important Message given?   (If response is "NO", the following Medicare IM given date fields will be blank) Date Medicare IM given:   Date Additional Medicare IM given:    Discharge Disposition:    Per UR Regulation:    If discussed at Long Length of Stay Meetings, dates discussed:    Comments:  11/03/2011 Raynelle Bring BSN CCm [phone] [facility] -Rep notified and can provide [facility] services to pt upon diischarge. RW will be delivered to pt's room by [facility]-DME rep before discharge. HH agency list placed in shadow chart.

## 2011-11-04 DIAGNOSIS — E871 Hypo-osmolality and hyponatremia: Secondary | ICD-10-CM

## 2011-11-04 LAB — BASIC METABOLIC PANEL
BUN: 6 mg/dL (ref 6–23)
CO2: 28 mEq/L (ref 19–32)
Calcium: 9.1 mg/dL (ref 8.4–10.5)
Chloride: 101 mEq/L (ref 96–112)
Creatinine, Ser: 0.7 mg/dL (ref 0.50–1.10)

## 2011-11-04 LAB — CBC
HCT: 33.7 % — ABNORMAL LOW (ref 36.0–46.0)
MCH: 31.3 pg (ref 26.0–34.0)
MCV: 93.4 fL (ref 78.0–100.0)
Platelets: 217 10*3/uL (ref 150–400)
RBC: 3.61 MIL/uL — ABNORMAL LOW (ref 3.87–5.11)
WBC: 12.6 10*3/uL — ABNORMAL HIGH (ref 4.0–10.5)

## 2011-11-04 MED ORDER — METHOCARBAMOL 500 MG PO TABS: 500.0000 mg | ORAL_TABLET | Freq: Four times a day (QID) | ORAL | Status: AC | PRN

## 2011-11-04 MED ORDER — HYDROCODONE-ACETAMINOPHEN 5-325 MG PO TABS: 1.0000 | ORAL_TABLET | ORAL | Status: AC | PRN

## 2011-11-04 MED ORDER — TRAMADOL HCL 50 MG PO TABS: 50.0000 mg | ORAL_TABLET | Freq: Four times a day (QID) | ORAL | Status: AC | PRN

## 2011-11-04 MED ORDER — TRAMADOL HCL 50 MG PO TABS
50.0000 mg | ORAL_TABLET | Freq: Four times a day (QID) | ORAL | Status: DC | PRN
  Administered 2011-11-04: 100 mg via ORAL
  Filled 2011-11-04: qty 2

## 2011-11-04 MED ORDER — HYDROCODONE-ACETAMINOPHEN 5-325 MG PO TABS: 1.0000 | ORAL_TABLET | ORAL | Status: DC | PRN

## 2011-11-04 MED ORDER — RIVAROXABAN 10 MG PO TABS: 10.0000 mg | ORAL_TABLET | Freq: Every day | ORAL | Status: DC

## 2011-11-04 NOTE — Progress Notes (Signed)
   Subjective: 2 Days Post-Op Procedure(s) (LRB): TOTAL KNEE ARTHROPLASTY (Left) Patient reports pain as mild.   Patient seen in rounds with Dr. Lequita Halt. Main complaint is the nausea.  Stopped the Oxy.  Starte Ultram and also Norco for moderate pain.  She feels that she could go home later today so we will set up discharge.  Probably after two sessions of therapy. Patient is well, but has had some minor complaints of nausea and pain in the knee, requiring pain medications Patient is ready to go home probably later today.  Objective: Vital signs in last 24 hours: Temp:  [98.2 F (36.8 C)-99.8 F (37.7 C)] 98.7 F (37.1 C) (08/02 0620) Pulse Rate:  [67-82] 67  (08/02 0620) Resp:  [16-17] 17  (08/02 0738) BP: (107-152)/(66-83) 111/67 mmHg (08/02 0620) SpO2:  [93 %-98 %] 93 % (08/02 0620)  Intake/Output from previous day:  Intake/Output Summary (Last 24 hours) at 11/04/11 0918 Last data filed at 11/04/11 0737  Gross per 24 hour  Intake 2524.16 ml  Output   2325 ml  Net 199.16 ml    Intake/Output this shift: Total I/O In: 240 [P.O.:240] Out: -   Labs:  Basename 11/04/11 0416 11/03/11 0350  HGB 11.3* 10.9*    Basename 11/04/11 0416 11/03/11 0350  WBC 12.6* 8.9  RBC 3.61* 3.51*  HCT 33.7* 33.5*  PLT 217 210    Basename 11/04/11 0416 11/03/11 0350  NA 135 134*  K 4.1 4.1  CL 101 103  CO2 28 27  BUN 6 12  CREATININE 0.70 0.74  GLUCOSE 132* 111*  CALCIUM 9.1 8.4   No results found for this basename: LABPT:2,INR:2 in the last 72 hours  EXAM: General - Patient is Alert, Appropriate and Oriented Extremity - Neurovascular intact Sensation intact distally Dorsiflexion/Plantar flexion intact No cellulitis present Compartment soft Incision - clean, dry, no drainage, healing Motor Function - intact, moving foot and toes well on exam.   Assessment/Plan: 2 Days Post-Op Procedure(s) (LRB): TOTAL KNEE ARTHROPLASTY (Left) Procedure(s) (LRB): TOTAL KNEE ARTHROPLASTY  (Left) Past Medical History  Diagnosis Date  . Sarcoidosis 1973  . Diverticula of colon   . Palpitations     normal echocardiogram and 48 hour Holter monitor in 2012  . Dysrhythmia     a-fib, or pvc's  . Thyroid nodule     follow[facility] by Dr. Mary Sella at [facility]   Principal Problem:  *OA (osteoarthritis) of knee Active Problems:  Postop Hyponatremia   Advance diet Up with therapy Discharge home with home health Diet - Cardiac diet Follow up - in 2 weeks Activity - WBAT Disposition - Home Condition Upon Discharge - Good D/C Meds - See DC Summary DVT Prophylaxis - Xarelto  PERKINS, ALEXZANDREW 11/04/2011, 9:18 AM

## 2011-11-04 NOTE — Progress Notes (Signed)
Occupational Therapy Treatment Patient Details Name: Danielle Nicholson MRN: [id number] DOB: June 10, 1945 Today's Date: 11/04/2011 Time: 4098-1191 OT Time Calculation (min): 17 min  OT Assessment / Plan / Recommendation Comments on Treatment Session Pt tolerated visit well. Pt verbalizes understanding of all education and feels family can assist adequately. Pt reports hopefully discharging this afternoon.    Follow Up Recommendations  No OT follow up    Barriers to Discharge       Equipment Recommendations  Rolling walker with 5" wheels    Recommendations for Other Services    Frequency Min 2X/week   Plan Discharge plan remains appropriate    Precautions / Restrictions Precautions Precautions: Knee Restrictions Weight Bearing Restrictions: No Other Position/Activity Restrictions: WBAT        ADL  Lower Body Dressing: Performed;Min guard;Other (comment) (donned underwear. min guard to pull up in standing) Where Assessed - Lower Body Dressing: Supported sit to stand Toilet Transfer: Simulated;Min Pension scheme manager Method: Sit to stand Tub/Shower Transfer: Simulated;Min guard Tub/Shower Transfer Method: Other (comment) (simulate step back over ledge and forwards coming out) Equipment Used: Rolling walker ADL Comments: Pt doing well. Practiced simulated shower transfer and pt verbalized understanding of how to have spouse assist with steadying RW. Pt has 3in1 and shower chair. Per pt, she did not wear KI this am with PT and observed pt doing SLR.    OT Diagnosis:    OT Problem List:   OT Treatment Interventions:     OT Goals ADL Goals ADL Goal: Lower Body Dressing - Progress: Progressing toward goals ADL Goal: Toilet Transfer - Progress: Progressing toward goals ADL Goal: Tub/Shower Transfer - Progress: Progressing toward goals  Visit Information  Last OT Received On: 11/04/11 Assistance Needed: +1    Subjective Data  Subjective: What do you need me to do? Patient Stated  Goal: home this afternoon   Prior Functioning       Cognition  Overall Cognitive Status: Appears within functional limits for tasks assessed/performed Arousal/Alertness: Awake/alert Orientation Level: Appears intact for tasks assessed Behavior During Session: [facility] for tasks performed    Mobility Transfers Transfers: Sit to Stand;Stand to Sit Sit to Stand: 4: Min guard;With upper extremity assist;From chair/3-in-1 Stand to Sit: 4: Min guard;With upper extremity assist;To chair/3-in-1   Exercises    Balance Balance Balance Assessed: Yes Dynamic Standing Balance Dynamic Standing - Level of Assistance: 5: Stand by assistance  End of Session OT - End of Session Activity Tolerance: Patient tolerated treatment well Patient left: in chair;with call bell/phone within reach CPM Left Knee CPM Left Knee: Off  GO     Lennox Laity [phone] 11/04/2011, 9:19 AM

## 2011-11-04 NOTE — Progress Notes (Signed)
Physical Therapy Treatment Patient Details Name: Ryllie Nieland MRN: [id number] DOB: Apr 13, 1945 Today's Date: 11/04/2011 Time: 3244-0102 PT Time Calculation (min): 38 min  PT Assessment / Plan / Recommendation Comments on Treatment Session       Follow Up Recommendations  Home health PT    Barriers to Discharge        Equipment Recommendations  Rolling walker with 5" wheels    Recommendations for Other Services OT consult  Frequency 7X/week   Plan Discharge plan remains appropriate    Precautions / Restrictions Precautions Precautions: Knee Required Braces or Orthoses: Knee Immobilizer - Left Knee Immobilizer - Left: Discontinue once straight leg raise with < 10 degree lag Restrictions Weight Bearing Restrictions: No Other Position/Activity Restrictions: WBAT   Pertinent Vitals/Pain 4/10, ice packs provided    Mobility  Transfers Transfers: Sit to Stand;Stand to Sit Sit to Stand: 4: Min guard;With upper extremity assist;From chair/3-in-1 Stand to Sit: 4: Min guard;With upper extremity assist;To chair/3-in-1 Details for Transfer Assistance: Min VCs for hand placement and LLE management. Ambulation/Gait Ambulation/Gait Assistance: 4: Min guard Ambulation Distance (Feet): 150 Feet Assistive device: Rolling walker Ambulation/Gait Assistance Details: min cues for posture and position from RW Gait Pattern: Step-to pattern    Exercises Total Joint Exercises Ankle Circles/Pumps: AROM;20 reps;Both;Supine Quad Sets: AROM;20 reps;Both;Supine Heel Slides: AAROM;20 reps;Left;Supine Straight Leg Raises: AROM;AAROM;20 reps;Supine;Left Long Arc Quad: AAROM;AROM;Supine;Both;10 reps   PT Diagnosis:    PT Problem List:   PT Treatment Interventions:     PT Goals Acute Rehab PT Goals PT Goal Formulation: With patient Time For Goal Achievement: 11/08/11 Potential to Achieve Goals: Good Pt will go Supine/Side to Sit: with supervision PT Goal: Supine/Side to Sit - Progress:  Progressing toward goal Pt will go Sit to Supine/Side: with supervision PT Goal: Sit to Supine/Side - Progress: Progressing toward goal Pt will go Sit to Stand: with supervision PT Goal: Sit to Stand - Progress: Progressing toward goal Pt will go Stand to Sit: with supervision PT Goal: Stand to Sit - Progress: Progressing toward goal Pt will Ambulate: 51 - 150 feet;with supervision;with rolling walker PT Goal: Ambulate - Progress: Progressing toward goal Pt will Go Up / Down Stairs: 3-5 stairs;with min assist;with least restrictive assistive device  Visit Information  Last PT Received On: 11/04/11 Assistance Needed: +1    Subjective Data  Subjective: The Dr says its up to you if I get to go home Patient Stated Goal: Resume previous lifestyle with decreased pain   Cognition  Overall Cognitive Status: Appears within functional limits for tasks assessed/performed Arousal/Alertness: Awake/alert Orientation Level: Appears intact for tasks assessed Behavior During Session: [facility] for tasks performed    Balance  Balance Balance Assessed: Yes Dynamic Standing Balance Dynamic Standing - Level of Assistance: 5: Stand by assistance  End of Session PT - End of Session Activity Tolerance: Patient tolerated treatment well Patient left: in chair;with call bell/phone within reach Nurse Communication: Mobility status   GP     Aimar Borghi 11/04/2011, 12:09 PM

## 2011-11-04 NOTE — Discharge Summary (Signed)
Physician Discharge Summary   Patient ID: Danielle Nicholson MRN: [id number] DOB/AGE: Nov 15, 1945 66 y.o.  Admit date: 11/02/2011 Discharge date: 11/04/2011  Primary Diagnosis: Osteoarthritis Left knee   Admission Diagnoses:  Past Medical History  Diagnosis Date  . Sarcoidosis 1973  . Diverticula of colon   . Palpitations     normal echocardiogram and 48 hour Holter monitor in 2012  . Dysrhythmia     a-fib, or pvc's  . Thyroid nodule     followed by Dr. Mary Sella at [facility]   Discharge Diagnoses:   Principal Problem:  *OA (osteoarthritis) of knee Active Problems:  Postop Hyponatremia  Procedure:  Procedure(s) (LRB): TOTAL KNEE ARTHROPLASTY (Left)   Consults: None  HPI: Danielle Nicholson is a 66 y.o. year old female with end stage OA of her left knee with progressively worsening pain and dysfunction. She has constant pain, with activity and at rest and significant functional deficits with difficulties even with ADLs. She has had extensive non-op management including analgesics, injections of cortisone and viscosupplements, and home exercise program, but remains in significant pain with significant dysfunction. Radiographs show bone on bone arthritis medial and patellofemoral. She presents now for left Total Knee Arthroplasty.       Laboratory Data: Hospital Outpatient Visit on 10/25/2011  Component Date Value Range Status  . MRSA, PCR 10/25/2011 POSITIVE* NEGATIVE Final   Comment: RESULT CALLED TO, READ BACK BY AND VERIFIED WITH:                          C PHILLIPS AT 1253 ON 07.23.2013 BY NBROOKS  . Staphylococcus aureus 10/25/2011 POSITIVE* NEGATIVE Final   Comment:                                 The Xpert SA Assay (FDA                          approved for NASAL specimens                          only), is one component of                          a comprehensive surveillance                          program.  It is not intended                          to diagnose infection  nor to                          guide or monitor treatment.  Marland Kitchen aPTT 10/25/2011 35  24 - 37 seconds Final  . WBC 10/25/2011 7.8  4.0 - 10.5 K/uL Final  . RBC 10/25/2011 4.55  3.87 - 5.11 MIL/uL Final  . Hemoglobin 10/25/2011 14.1  12.0 - 15.0 g/dL Final  . HCT 21/30/8657 43.0  36.0 - 46.0 % Final  . MCV 10/25/2011 94.5  78.0 - 100.0 fL Final  . MCH 10/25/2011 31.0  26.0 - 34.0 pg Final  . MCHC 10/25/2011 32.8  30.0 - 36.0 g/dL Final  . RDW 84/69/6295 12.6  11.5 -  15.5 % Final  . Platelets 10/25/2011 254  150 - 400 K/uL Final  . Sodium 10/25/2011 138  135 - 145 mEq/L Final  . Potassium 10/25/2011 3.5  3.5 - 5.1 mEq/L Final  . Chloride 10/25/2011 102  96 - 112 mEq/L Final  . CO2 10/25/2011 27  19 - 32 mEq/L Final  . Glucose, Bld 10/25/2011 100* 70 - 99 mg/dL Final  . BUN 16/01/9603 14  6 - 23 mg/dL Final  . Creatinine, Ser 10/25/2011 0.76  0.50 - 1.10 mg/dL Final  . Calcium 54/12/8117 9.7  8.4 - 10.5 mg/dL Final  . Total Protein 10/25/2011 7.1  6.0 - 8.3 g/dL Final  . Albumin 14/78/2956 3.9  3.5 - 5.2 g/dL Final  . AST 21/30/8657 16  0 - 37 U/L Final  . ALT 10/25/2011 13  0 - 35 U/L Final  . Alkaline Phosphatase 10/25/2011 99  39 - 117 U/L Final  . Total Bilirubin 10/25/2011 0.5  0.3 - 1.2 mg/dL Final  . GFR calc non Af Amer 10/25/2011 85* >90 mL/min Final  . GFR calc Af Amer 10/25/2011 >90  >90 mL/min Final   Comment:                                 The eGFR has been calculated                          using the CKD EPI equation.                          This calculation has not been                          validated in all clinical                          situations.                          eGFR's persistently                          <90 mL/min signify                          possible Chronic Kidney Disease.  Marland Kitchen Prothrombin Time 10/25/2011 12.6  11.6 - 15.2 seconds Final  . INR 10/25/2011 0.92  0.00 - 1.49 Final  . Color, Urine 10/25/2011 YELLOW  YELLOW Final  . APPearance  10/25/2011 TURBID* CLEAR Final  . Specific Gravity, Urine 10/25/2011 1.019  1.005 - 1.030 Final  . pH 10/25/2011 6.0  5.0 - 8.0 Final  . Glucose, UA 10/25/2011 NEGATIVE  NEGATIVE mg/dL Final  . Hgb urine dipstick 10/25/2011 MODERATE* NEGATIVE Final  . Bilirubin Urine 10/25/2011 NEGATIVE  NEGATIVE Final  . Ketones, ur 10/25/2011 NEGATIVE  NEGATIVE mg/dL Final  . Protein, ur 84/69/6295 NEGATIVE  NEGATIVE mg/dL Final  . Urobilinogen, UA 10/25/2011 0.2  0.0 - 1.0 mg/dL Final  . Nitrite 28/41/3244 NEGATIVE  NEGATIVE Final  . Leukocytes, UA 10/25/2011 LARGE* NEGATIVE Final  . WBC, UA 10/25/2011 FIELD OBSCURED BY WBC'S  <3 WBC/hpf Final  . Bacteria, UA 10/25/2011 MANY* RARE Final    Basename 11/04/11 0416 11/03/11  0350  HGB 11.3* 10.9*    Basename 11/04/11 0416 11/03/11 0350  WBC 12.6* 8.9  RBC 3.61* 3.51*  HCT 33.7* 33.5*  PLT 217 210    Basename 11/04/11 0416 11/03/11 0350  NA 135 134*  K 4.1 4.1  CL 101 103  CO2 28 27  BUN 6 12  CREATININE 0.70 0.74  GLUCOSE 132* 111*  CALCIUM 9.1 8.4   No results found for this basename: LABPT:2,INR:2 in the last 72 hours  X-Rays:Dg Chest 2 View  10/25/2011  *RADIOLOGY REPORT*  Clinical Data: Preop knee surgery  CHEST - 2 VIEW  Comparison: None.  Findings: Heart size and vascularity are normal.  The lungs are clear without infiltrate effusion or mass.  Lung volume is normal.  IMPRESSION: No active cardiopulmonary disease.  Original Report Authenticated By: Camelia Phenes, M.D.    EKG: Orders placed in visit on 06/27/11  . EKG 12-LEAD     Hospital Course: Patient was admitted to [facility] and taken to the OR and underwent the above state procedure without complications.  Patient tolerated the procedure well and was later transferred to the recovery room and then to the orthopaedic floor for postoperative care.  They were given PO and IV analgesics for pain control following their surgery.  They were given 24 hours of  postoperative antibiotics and started on DVT prophylaxis in the form of Xarelto.   PT and OT were ordered for total joint protocol.  Discharge planning consulted to help with postop disposition and equipment needs.  Patient had a rough night on the evening of surgery and started to get up OOB with therapy on day one.  PCA Morphine was discontinued and they were weaned over to PO meds.  Hemovac drain was pulled without difficulty.  Continued to work with therapy into day two. Main complaint is the nausea. Stopped the Oxy. Starte Ultram and also Norco for moderate pain. She feels that she could go home later today so we will set up discharge. Probably after two sessions of therapy. Dressing was changed on day two and the incision was healing well.  By the afternoon, the patient had progressed with therapy and meeting their goals.  Incision was healing well.  Patient was ready to go home.  Discharge Medications: Prior to Admission medications   Medication Sig Start Date End Date Taking? Authorizing Provider  propranolol (INDERAL LA) 60 MG 24 hr capsule Take 1 capsule (60 mg total) by mouth every evening. 06/27/11 06/26/12 Yes June Leap, MD  psyllium (METAMUCIL) 58.6 % powder Take 1 packet by mouth daily.   Yes Historical Provider, MD  aspirin 81 MG tablet Take 81 mg by mouth daily.     Historical Provider, MD  HYDROcodone-acetaminophen (NORCO/VICODIN) 5-325 MG per tablet Take 1-2 tablets by mouth every 4 (four) hours as needed (moderate pain). 11/04/11 11/14/11  Marjoria Mancillas, PA  methocarbamol (ROBAXIN) 500 MG tablet Take 1 tablet (500 mg total) by mouth every 6 (six) hours as needed. 11/04/11 11/14/11  Thelma Lorenzetti Julien Girt, PA  rivaroxaban (XARELTO) 10 MG TABS tablet Take 1 tablet (10 mg total) by mouth daily with breakfast. Take Xarelto for two and a half more weeks, then discontinue Xarelto. Once the patient has completed the Xarelto, they may resume the 81 mg Aspirin. 11/04/11   Jaevin Medearis Julien Girt, PA    traMADol (ULTRAM) 50 MG tablet Take 1-2 tablets (50-100 mg total) by mouth every 6 (six) hours as needed for pain. 11/04/11 11/14/11  Gennesis Hogland [place], PA    Diet: Cardiac diet Activity:WBAT Follow-up:in 2 weeks Disposition - Home Discharged Condition: good   Discharge Orders    Future Orders Please Complete By Expires   Diet - low sodium heart healthy      Call MD / Call 911      Comments:   If you experience chest pain or shortness of breath, CALL 911 and be transported to the hospital emergency room.  If you develope a fever above 101 F, pus (white drainage) or increased drainage or redness at the wound, or calf pain, call your surgeon's office.   Discharge instructions      Comments:   Pick up stool softner and laxative for home. Do not submerge incision under water. May shower. Continue to use ice for pain and swelling from surgery.  Take Xarelto for two and a half more weeks, then discontinue Xarelto. Once the patient has completed the Xarelto, they may resume the 81 mg Aspirin.   Constipation Prevention      Comments:   Drink plenty of fluids.  Prune juice may be helpful.  You may use a stool softener, such as Colace (over the counter) 100 mg twice a day.  Use MiraLax (over the counter) for constipation as needed.   Increase activity slowly as tolerated      Patient may shower      Comments:   You may shower without a dressing once there is no drainage.  Do not wash over the wound.  If drainage remains, do not shower until drainage stops.   Driving restrictions      Comments:   No driving until released by the physician.   Lifting restrictions      Comments:   No lifting until released by the physician.   TED hose      Comments:   Use stockings (TED hose) for 3 weeks on both leg(s).  You may remove them at night for sleeping.   Change dressing      Comments:   Change dressing daily with sterile 4 x 4 inch gauze dressing and apply TED hose. Do not submerge the  incision under water.   Do not put a pillow under the knee. Place it under the heel.      Do not sit on low chairs, stoools or toilet seats, as it may be difficult to get up from low surfaces        Medication List  As of 11/04/2011  9:25 AM   STOP taking these medications         ALEVE 220 MG Caps      multivitamin tablet         TAKE these medications         aspirin 81 MG tablet   Take 81 mg by mouth daily.      HYDROcodone-acetaminophen 5-325 MG per tablet   Commonly known as: NORCO/VICODIN   Take 1-2 tablets by mouth every 4 (four) hours as needed (moderate pain).      methocarbamol 500 MG tablet   Commonly known as: ROBAXIN   Take 1 tablet (500 mg total) by mouth every 6 (six) hours as needed.      propranolol ER 60 MG 24 hr capsule   Commonly known as: INDERAL LA   Take 1 capsule (60 mg total) by mouth every evening.      psyllium 58.6 % powder   Commonly known as: METAMUCIL   Take 1 packet  by mouth daily.      rivaroxaban 10 MG Tabs tablet   Commonly known as: XARELTO   Take 1 tablet (10 mg total) by mouth daily with breakfast. Take Xarelto for two and a half more weeks, then discontinue Xarelto.  Once the patient has completed the Xarelto, they may resume the 81 mg Aspirin.      traMADol 50 MG tablet   Commonly known as: ULTRAM   Take 1-2 tablets (50-100 mg total) by mouth every 6 (six) hours as needed for pain.           Follow-up Information    Follow up with Loanne Drilling, MD. Schedule an appointment as soon as possible for a visit in 2 weeks.   Contact information:   [facility] [address], [address] [place] [address] [phone]          Signed: Patrica Duel 11/04/2011, 9:25 AM

## 2011-11-04 NOTE — Progress Notes (Signed)
Physical Therapy Treatment Patient Details Name: Danielle Nicholson MRN: [id number] DOB: 1945/10/12 Today's Date: 11/04/2011 Time: 4098-1191 PT Time Calculation (min): 23 min  PT Assessment / Plan / Recommendation Comments on Treatment Session       Follow Up Recommendations  Home health PT    Barriers to Discharge        Equipment Recommendations  Rolling walker with 5" wheels    Recommendations for Other Services OT consult  Frequency 7X/week   Plan Discharge plan remains appropriate    Precautions / Restrictions Precautions Precautions: Knee Required Braces or Orthoses: Knee Immobilizer - Left (pt performed IND SLR this am) Knee Immobilizer - Left: Discontinue once straight leg raise with < 10 degree lag Restrictions Weight Bearing Restrictions: No Other Position/Activity Restrictions: WBAT   Pertinent Vitals/Pain 4/10    Mobility  Transfers Transfers: Sit to Stand;Stand to Sit Sit to Stand: 5: Supervision Stand to Sit: 5: Supervision Ambulation/Gait Ambulation/Gait Assistance: 4: Min guard;5: Supervision Ambulation Distance (Feet): 150 Feet Assistive device: Rolling walker Ambulation/Gait Assistance Details: cues for posture and position from RW Gait Pattern: Step-to pattern Stairs: Yes Stairs Assistance: 4: Min assist Stairs Assistance Details (indicate cue type and reason): cues for sequence and for foot/crutch placement Stair Management Technique: No rails;One rail Right;Step to pattern;Forwards;With crutches Number of Stairs: 4     Exercises     PT Diagnosis:    PT Problem List:   PT Treatment Interventions:     PT Goals Acute Rehab PT Goals PT Goal Formulation: With patient Time For Goal Achievement: 11/08/11 Potential to Achieve Goals: Good Pt will go Supine/Side to Sit: with supervision PT Goal: Supine/Side to Sit - Progress: Progressing toward goal Pt will go Sit to Supine/Side: with supervision PT Goal: Sit to Supine/Side - Progress: Progressing  toward goal Pt will go Sit to Stand: with supervision PT Goal: Sit to Stand - Progress: Met Pt will go Stand to Sit: with supervision PT Goal: Stand to Sit - Progress: Met Pt will Ambulate: 51 - 150 feet;with supervision;with rolling walker PT Goal: Ambulate - Progress: Progressing toward goal Pt will Go Up / Down Stairs: 3-5 stairs;with min assist;with least restrictive assistive device PT Goal: Up/Down Stairs - Progress: Met  Visit Information  Last PT Received On: 11/04/11 Assistance Needed: +1    Subjective Data  Subjective: No new complaints Patient Stated Goal: Resume previous lifestyle with decreased pain   Cognition  Overall Cognitive Status: Appears within functional limits for tasks assessed/performed Arousal/Alertness: Awake/alert Orientation Level: Appears intact for tasks assessed Behavior During Session: [facility] for tasks performed    Balance     End of Session PT - End of Session Equipment Utilized During Treatment: Gait belt Activity Tolerance: Patient tolerated treatment well Patient left: in chair;with call bell/phone within reach Nurse Communication: Mobility status   GP     Danielle Nicholson 11/04/2011, 1:29 PM

## 2011-12-22 ENCOUNTER — Ambulatory Visit: Payer: Medicare Other | Admitting: Cardiology

## 2012-01-02 ENCOUNTER — Encounter: Payer: Self-pay | Admitting: Cardiology

## 2012-01-02 ENCOUNTER — Ambulatory Visit (INDEPENDENT_AMBULATORY_CARE_PROVIDER_SITE_OTHER): Payer: Medicare Other | Admitting: Cardiology

## 2012-01-02 VITALS — BP 125/72 | HR 70 | Ht 70.5 in | Wt 181.0 lb

## 2012-01-02 DIAGNOSIS — R002 Palpitations: Secondary | ICD-10-CM

## 2012-01-02 MED ORDER — PROPRANOLOL HCL ER 60 MG PO CP24: 60.0000 mg | ORAL_CAPSULE | Freq: Every evening | ORAL | Status: DC

## 2012-01-02 NOTE — Progress Notes (Signed)
   Clinical Summary [name] is a 66 y.o.female presenting for followup. She is a former patient of Dr. Andee Lineman, last seen in March. She has been treated with beta blocker therapy with history of intermittent palpitations, p.r.n. Metoprolol, and most recently long-acting propranolol.  Echocardiogram report reviewed from January 2012 indicating LVEF 60-65% with normal left atrial chamber size, no significant valvular abnormalities.  She states she has done fairly well in general, better control of palpitations with long-acting beta blocker. She still has them occasionally. No associated lightheadedness or syncope. States she had knee surgery in the interim, has been recuperating from this, but is getting back up to her usual activities.   No Known Allergies  Current Outpatient Prescriptions  Medication Sig Dispense Refill  . aspirin 81 MG tablet Take 81 mg by mouth daily.       . propranolol ER (INDERAL LA) 60 MG 24 hr capsule Take 1 capsule (60 mg total) by mouth every evening.  90 capsule  3  . psyllium (METAMUCIL) 58.6 % powder Take 1 packet by mouth daily.      Marland Kitchen DISCONTD: propranolol (INDERAL LA) 60 MG 24 hr capsule Take 1 capsule (60 mg total) by mouth every evening.  30 capsule  6  . DISCONTD: Calcium Acetate, Phos Binder, (CALCIUM ACETATE PO) Take by mouth.          Past Medical History  Diagnosis Date  . Sarcoidosis 1973  . Diverticula of colon   . Palpitations     PACs and PVCs by HOlter  . Thyroid nodule     Dr. Mary Sella at [facility]    Social History [name] reports that she has never smoked. She has never used smokeless tobacco. [name] reports that she drinks about 1.2 ounces of alcohol per week.  Review of Systems Otherwise negative.  Physical Examination Filed Vitals:   01/02/12 1304  BP: 125/72  Pulse: 70   Filed Weights   01/02/12 1304  Weight: 181 lb (82.101 kg)   Patient in no acute distress. HEENT: Conjunctiva and lids normal, oropharynx  clear. Neck: Supple, no elevated JVP or carotid bruits, no thyromegaly. Lungs: Clear to auscultation, nonlabored breathing at rest. Cardiac: Regular rate and rhythm, no S3 or significant systolic murmur, no pericardial rub. Extremities: No obvious edema. Skin: Warm and dry.   Problem List and Plan   PALPITATIONS History of prior documented PACs and PVCs. Symptomatically better controlled on propranolol. No changes made to current regimen. Refill provided for 90 days. We will see her back in 6 months.    Jonelle Sidle, M.D., F.A.C.C.

## 2012-01-02 NOTE — Patient Instructions (Signed)
Continue all current medications. Your physician wants you to follow up in: 6 months.  You will receive a reminder letter in the mail one-two months in advance.  If you don't receive a letter, please call our office to schedule the follow up appointment   

## 2012-01-02 NOTE — Assessment & Plan Note (Signed)
History of prior documented PACs and PVCs. Symptomatically better controlled on propranolol. No changes made to current regimen. Refill provided for 90 days. We will see her back in 6 months.

## 2012-06-13 ENCOUNTER — Encounter: Payer: Self-pay | Admitting: Cardiology

## 2012-06-13 ENCOUNTER — Ambulatory Visit (INDEPENDENT_AMBULATORY_CARE_PROVIDER_SITE_OTHER): Payer: Medicare Other | Admitting: Cardiology

## 2012-06-13 VITALS — BP 119/77 | HR 70 | Ht 70.5 in | Wt 184.0 lb

## 2012-06-13 DIAGNOSIS — R002 Palpitations: Secondary | ICD-10-CM

## 2012-06-13 NOTE — Patient Instructions (Signed)
Continue all current medications. Your physician wants you to follow up in: 6 months.  You will receive a reminder letter in the mail one-two months in advance.  If you don't receive a letter, please call our office to schedule the follow up appointment   

## 2012-06-13 NOTE — Assessment & Plan Note (Signed)
Symptomatically stable. For now will stay off of propranolol and observe. This could always be resumed with flares of palpitations. ECG is normal today. Followup arranged.

## 2012-06-13 NOTE — Progress Notes (Signed)
   Clinical Summary Ms. Sundquist is a 67 y.o.female last seen in September 2013. She has been doing well. Not bothered by any major progressive palpitations. Occasionally has some in the evenings and early morning hours. Of note, she forgot her propranolol while taking a trip, ultimately decided that being off of the medication did not make any major difference, and in some ways she felt somewhat less sluggish. She has not taken it now for several weeks. We have decided to continue observation.  ECG today shows normal sinus rhythm.  No Known Allergies  Current Outpatient Prescriptions  Medication Sig Dispense Refill  . aspirin 81 MG tablet Take 81 mg by mouth daily.       . psyllium (METAMUCIL) 58.6 % powder Take 1 packet by mouth daily.      . [DISCONTINUED] Calcium Acetate, Phos Binder, (CALCIUM ACETATE PO) Take by mouth.         No current facility-administered medications for this visit.    Past Medical History  Diagnosis Date  . Sarcoidosis 1973  . Diverticula of colon   . Palpitations     PACs and PVCs by Holter  . Thyroid nodule     Dr. Mary Sella at [facility]    Social History Ms. Hagwood reports that she has never smoked. She has never used smokeless tobacco. Ms. Mcclafferty reports that she drinks about 1.2 ounces of alcohol per week.  Review of Systems Otherwise negative.  Physical Examination Filed Vitals:   06/13/12 1316  BP: 119/77  Pulse: 70   Filed Weights   06/13/12 1316  Weight: 184 lb (83.462 kg)    Patient in no acute distress.  HEENT: Conjunctiva and lids normal, oropharynx clear.  Neck: Supple, no elevated JVP or carotid bruits, no thyromegaly.  Lungs: Clear to auscultation, nonlabored breathing at rest.  Cardiac: Regular rate and rhythm, no S3 or significant systolic murmur, no pericardial rub.  Extremities: No obvious edema.  Skin: Warm and dry.   Problem List and Plan   PALPITATIONS Symptomatically stable. For now will stay off of propranolol and  observe. This could always be resumed with flares of palpitations. ECG is normal today. Followup arranged.    Jonelle Sidle, M.D., F.A.C.C.

## 2012-12-12 ENCOUNTER — Ambulatory Visit (INDEPENDENT_AMBULATORY_CARE_PROVIDER_SITE_OTHER): Payer: Medicare Other | Admitting: Cardiology

## 2012-12-12 ENCOUNTER — Encounter: Payer: Self-pay | Admitting: Cardiology

## 2012-12-12 VITALS — BP 113/72 | HR 77 | Ht 70.5 in | Wt 192.0 lb

## 2012-12-12 DIAGNOSIS — R002 Palpitations: Secondary | ICD-10-CM

## 2012-12-12 MED ORDER — PROPRANOLOL HCL 10 MG PO TABS: 10.0000 mg | ORAL_TABLET | Freq: Three times a day (TID) | ORAL | Status: DC | PRN

## 2012-12-12 NOTE — Patient Instructions (Addendum)
Your physician recommends that you schedule a follow-up appointment in: 6 months. You will receive a reminder letter in the mail in about 4 months reminding you to call and schedule your appointment. If you don't receive this letter, please contact our office. Your physician has recommended you make the following change in your medication: start propranolol 10 mg by mouth three times daily as needed for palpitations. Your new prescriptions was sent to your pharmacy.

## 2012-12-12 NOTE — Progress Notes (Signed)
   Clinical Summary Ms. Svehla is a 67 y.o.female last seen in March. She states that she has been doing well, enjoying retirement. She reports occasional, brief palpitations. She has not taken propranolol regularly for quite some time. We discussed using this at low dose as needed. Otherwise no exertional chest pain or unusual shortness of breath.   No Known Allergies  Current Outpatient Prescriptions  Medication Sig Dispense Refill  . psyllium (METAMUCIL) 58.6 % powder Take 1 packet by mouth daily.      . propranolol (INDERAL) 10 MG tablet Take 1 tablet (10 mg total) by mouth 3 (three) times daily as needed.  90 tablet  1  . [DISCONTINUED] Calcium Acetate, Phos Binder, (CALCIUM ACETATE PO) Take by mouth.         No current facility-administered medications for this visit.    Past Medical History  Diagnosis Date  . Sarcoidosis 1973  . Diverticula of colon   . Palpitations     PACs and PVCs by Holter  . Thyroid nodule     Dr. Mary Sella at [facility]    Social History Ms. Mayson reports that she has never smoked. She has never used smokeless tobacco. Ms. Pola reports that she drinks about 1.2 ounces of alcohol per week.  Review of Systems Negative except as outlined.  Physical Examination Filed Vitals:   12/12/12 0813  BP: 113/72  Pulse: 77   Filed Weights   12/12/12 0813  Weight: 192 lb (87.091 kg)   Patient in no acute distress.  HEENT: Conjunctiva and lids normal, oropharynx clear.  Neck: Supple, no elevated JVP or carotid bruits, no thyromegaly.  Lungs: Clear to auscultation, nonlabored breathing at rest.  Cardiac: Regular rate and rhythm, no S3 or significant systolic murmur, no pericardial rub.  Extremities: No obvious edema.  Skin: Warm and dry.    Problem List and Plan   Palpitations Continue observation. Will give her a prescription for propranolol 10 mg by mouth 3 times a day as needed. Followup arranged.    Jonelle Sidle, M.D., F.A.C.C.

## 2012-12-12 NOTE — Assessment & Plan Note (Signed)
Continue observation. Will give her a prescription for propranolol 10 mg by mouth 3 times a day as needed. Followup arranged.

## 2013-06-10 ENCOUNTER — Ambulatory Visit (INDEPENDENT_AMBULATORY_CARE_PROVIDER_SITE_OTHER): Payer: Medicare Other | Admitting: Cardiology

## 2013-06-10 ENCOUNTER — Encounter: Payer: Self-pay | Admitting: Cardiology

## 2013-06-10 VITALS — BP 127/80 | HR 74 | Ht 70.5 in | Wt 195.8 lb

## 2013-06-10 DIAGNOSIS — R002 Palpitations: Secondary | ICD-10-CM

## 2013-06-10 NOTE — Assessment & Plan Note (Signed)
Continue observation. ECG reviewed, no significant changes. Recommended regular exercise.

## 2013-06-10 NOTE — Patient Instructions (Signed)

## 2013-06-10 NOTE — Progress Notes (Signed)
    Clinical Summary Ms. Nevils is a 68 y.o.female last seen in September 2014. She has done well, has only occasional brief palpitations, nothing prolonged. She has not used any Inderal.  ECG today shows normal sinus rhythm.  She has been retired for 4 years. Stays active with chores, also goes to the gym. No exertional shortness of breath or chest pain.  No Known Allergies  Current Outpatient Prescriptions  Medication Sig Dispense Refill  . psyllium (METAMUCIL) 58.6 % powder Take 1 packet by mouth daily.      . [DISCONTINUED] Calcium Acetate, Phos Binder, (CALCIUM ACETATE PO) Take by mouth.         No current facility-administered medications for this visit.    Past Medical History  Diagnosis Date  . Sarcoidosis 1973  . Diverticula of colon   . Palpitations     PACs and PVCs by Holter  . Thyroid nodule     Dr. Caryl Never at [facility] Ms. Goebel reports that she has never smoked. She has never used smokeless tobacco. Ms. Dunkel reports that she drinks about 1.2 ounces of alcohol per week.  Review of Systems Negative except as outlined.  Physical Examination Filed Vitals:   06/10/13 1437  BP: 127/80  Pulse: 74   Filed Weights   06/10/13 1437  Weight: 195 lb 12.8 oz (88.814 kg)    Patient in no acute distress.  HEENT: Conjunctiva and lids normal, oropharynx clear.  Neck: Supple, no elevated JVP or carotid bruits, no thyromegaly.  Lungs: Clear to auscultation, nonlabored breathing at rest.  Cardiac: Regular rate and rhythm, no S3 or significant systolic murmur, no pericardial rub.  Extremities: No obvious edema.  Skin: Warm and dry.   Problem List and Plan   Palpitations Continue observation. ECG reviewed, no significant changes. Recommended regular exercise.    Satira Sark, M.D., F.A.C.C.

## 2013-12-23 ENCOUNTER — Encounter: Payer: Self-pay | Admitting: Cardiology

## 2013-12-23 NOTE — Progress Notes (Signed)
    Clinical Summary Ms. Love is a 68 y.o.female last seen in March. She reports only occasional palpitations, nothing progressive. Has not required any medical therapy for this. She continues to exercise at the [facility] using a stationary bicycle predominantly. She has had left knee surgery, has arthritis in her right knee that limits her somewhat. States she is enjoying retirement. No other major concerns today. Last ECG was in March.   No Known Allergies  Current Outpatient Prescriptions  Medication Sig Dispense Refill  . psyllium (METAMUCIL) 58.6 % powder Take 1 packet by mouth daily.      . [DISCONTINUED] Calcium Acetate, Phos Binder, (CALCIUM ACETATE PO) Take by mouth.         No current facility-administered medications for this visit.    Past Medical History  Diagnosis Date  . Sarcoidosis 1973  . Diverticula of colon   . Palpitations     PACs and PVCs by Holter  . Thyroid nodule     Dr. Caryl Never at [facility] Ms. Baik reports that she has never smoked. She has never used smokeless tobacco. Ms. Kuba reports that she drinks about 1.2 ounces of alcohol per week.  Review of Systems Other systems reviewed and negative except as outlined.  Physical Examination Filed Vitals:   12/24/13 0814  BP: 127/82  Pulse: 75   Filed Weights   12/24/13 0814  Weight: 201 lb (91.173 kg)    Patient in no acute distress.  HEENT: Conjunctiva and lids normal, oropharynx clear.  Neck: Supple, no elevated JVP or carotid bruits, no thyromegaly.  Lungs: Clear to auscultation, nonlabored breathing at rest.  Cardiac: Regular rate and rhythm, no S3 or significant systolic murmur, no pericardial rub.  Extremities: No obvious edema.  Skin: Warm and dry   Problem List and Plan   Palpitations No progression in symptoms, not requiring any specific medical therapy at this time. She tells me that she would like to continue to follow in case her symptoms worsen down the road, will  make a one-year visit. Otherwise keep routine visits with Dr. Woody Seller.    Satira Sark, M.D., F.A.C.C.

## 2013-12-24 ENCOUNTER — Encounter: Payer: Self-pay | Admitting: Cardiology

## 2013-12-24 ENCOUNTER — Ambulatory Visit (INDEPENDENT_AMBULATORY_CARE_PROVIDER_SITE_OTHER): Payer: Medicare Other | Admitting: Cardiology

## 2013-12-24 VITALS — BP 127/82 | HR 75 | Ht 70.0 in | Wt 201.0 lb

## 2013-12-24 DIAGNOSIS — R002 Palpitations: Secondary | ICD-10-CM

## 2013-12-24 NOTE — Assessment & Plan Note (Signed)
No progression in symptoms, not requiring any specific medical therapy at this time. She tells me that she would like to continue to follow in case her symptoms worsen down the road, will make a one-year visit. Otherwise keep routine visits with Dr. Woody Seller.

## 2013-12-24 NOTE — Patient Instructions (Signed)

## 2014-12-04 ENCOUNTER — Ambulatory Visit (INDEPENDENT_AMBULATORY_CARE_PROVIDER_SITE_OTHER): Payer: Medicare Other | Admitting: Cardiology

## 2014-12-04 ENCOUNTER — Encounter: Payer: Self-pay | Admitting: Cardiology

## 2014-12-04 VITALS — BP 110/74 | HR 73 | Ht 70.0 in | Wt 211.0 lb

## 2014-12-04 DIAGNOSIS — R002 Palpitations: Secondary | ICD-10-CM

## 2014-12-04 NOTE — Patient Instructions (Signed)

## 2014-12-04 NOTE — Progress Notes (Signed)
    Cardiology Office Note  Date: 12/04/2014   ID: Danielle Nicholson, DOB 1945-12-05, MRN [id number]  PCP: Glenda Chroman., MD  Primary Cardiologist: Rozann Lesches, MD   Chief Complaint  Patient presents with  . Palpitations    History of Present Illness: Danielle Nicholson is a 69 y.o. female last seen in September 2015. She presents for a routine visit. Since last encounter, she reports no significant progression in palpitations. She tries to exercise, but has had some trouble with arthritic pains in her feet. She and her husband are in the process of moving into a new home, downsizing somewhat.  ECG today shows sinus rhythm with borderline low voltage.   Past Medical History  Diagnosis Date  . Sarcoidosis 1973  . Diverticula of colon   . Palpitations     PACs and PVCs by Holter  . Thyroid nodule     Dr. Caryl Never at [facility]    Current Outpatient Prescriptions  Medication Sig Dispense Refill  . Calcium Carbonate-Vitamin D (CALTRATE 600+D) 600-400 MG-UNIT per tablet Take 1 tablet by mouth 2 (two) times daily.    . [DISCONTINUED] Calcium Acetate, Phos Binder, (CALCIUM ACETATE PO) Take by mouth.       No current facility-administered medications for this visit.    Allergies:  Review of patient's allergies indicates no known allergies.   Social History: The patient  reports that she has never smoked. She has never used smokeless tobacco. She reports that she drinks about 1.2 oz of alcohol per week. She reports that she does not use illicit drugs.   ROS:  Please see the history of present illness. Otherwise, complete review of systems is positive for arthritic pains. No sudden dizziness or syncope.  All other systems are reviewed and negative.   Physical Exam: VS:  BP 110/74 mmHg  Pulse 73  Ht 5\' 10"  (1.778 m)  Wt 211 lb (95.709 kg)  BMI 30.28 kg/m2, BMI Body mass index is 30.28 kg/(m^2).  Wt Readings from Last 3 Encounters:  12/04/14 211 lb (95.709 kg)  12/24/13 201 lb (91.173  kg)  06/10/13 195 lb 12.8 oz (88.814 kg)     Patient in no acute distress.  HEENT: Conjunctiva and lids normal, oropharynx clear.  Neck: Supple, no elevated JVP or carotid bruits, no thyromegaly.  Lungs: Clear to auscultation, nonlabored breathing at rest.  Cardiac: Regular rate and rhythm, no S3 or significant systolic murmur, no pericardial rub.  Extremities: No obvious edema.  Skin: Warm and dry   ECG: ECG is ordered today.   Other Studies Reviewed Today:  Echocardiogram 04/27/2010 [facility]) reported LVEF 60-65% without significant valvular abnormalities.   Assessment and Plan:  History of palpitations, relatively quiescent at this point. Follow-up ECG shows sinus rhythm. I recommended continued activity/exercise as tolerated. She would like to continue to follow-up on a one-year basis.  Current medicines were reviewed with the patient today.   Orders Placed This Encounter  Procedures  . EKG 12-Lead    Disposition: FU with me in 1 year.   Signed, Satira Sark, MD, [facility] 12/04/2014 8:31 AM    [facility] at [place], [place],  [address] Phone: [phone]; Fax: [phone]

## 2015-08-10 DIAGNOSIS — J3489 Other specified disorders of nose and nasal sinuses: Secondary | ICD-10-CM | POA: Diagnosis not present

## 2015-08-10 DIAGNOSIS — E041 Nontoxic single thyroid nodule: Secondary | ICD-10-CM | POA: Diagnosis not present

## 2015-08-10 DIAGNOSIS — L989 Disorder of the skin and subcutaneous tissue, unspecified: Secondary | ICD-10-CM | POA: Diagnosis not present

## 2015-09-22 DIAGNOSIS — Z79899 Other long term (current) drug therapy: Secondary | ICD-10-CM | POA: Diagnosis not present

## 2015-09-22 DIAGNOSIS — Z0000 Encounter for general adult medical examination without abnormal findings: Secondary | ICD-10-CM | POA: Diagnosis not present

## 2015-09-22 DIAGNOSIS — Z7189 Other specified counseling: Secondary | ICD-10-CM | POA: Diagnosis not present

## 2015-09-22 DIAGNOSIS — Z6829 Body mass index (BMI) 29.0-29.9, adult: Secondary | ICD-10-CM | POA: Diagnosis not present

## 2015-09-22 DIAGNOSIS — R5383 Other fatigue: Secondary | ICD-10-CM | POA: Diagnosis not present

## 2015-09-22 DIAGNOSIS — Z1211 Encounter for screening for malignant neoplasm of colon: Secondary | ICD-10-CM | POA: Diagnosis not present

## 2015-09-22 DIAGNOSIS — Z1389 Encounter for screening for other disorder: Secondary | ICD-10-CM | POA: Diagnosis not present

## 2015-09-22 DIAGNOSIS — Z299 Encounter for prophylactic measures, unspecified: Secondary | ICD-10-CM | POA: Diagnosis not present

## 2015-10-19 DIAGNOSIS — S8001XA Contusion of right knee, initial encounter: Secondary | ICD-10-CM | POA: Diagnosis not present

## 2015-10-28 ENCOUNTER — Encounter (INDEPENDENT_AMBULATORY_CARE_PROVIDER_SITE_OTHER): Payer: Self-pay | Admitting: *Deleted

## 2015-11-23 DIAGNOSIS — L509 Urticaria, unspecified: Secondary | ICD-10-CM | POA: Diagnosis not present

## 2015-12-03 NOTE — Progress Notes (Signed)
Cardiology Office Note  Date: 12/04/2015   ID: Danielle Nicholson, DOB Feb 14, 1946, MRN [id number]  PCP: Glenda Chroman, MD  Primary Cardiologist: Rozann Lesches, MD   Chief Complaint  Patient presents with  . Follow-up palpitations    History of Present Illness: Danielle Nicholson is a 70 y.o. female last seen in September 2016. She presents for a routine follow-up visit. Continues to do well overall, enjoying retirement. She and her husband have a new house in the [place]. She has had some orthopedic problems with her knees, also varicose veins. Reports an occasional sense of palpitations, but has not had to take any specific medications for this. She does have an old bottle of propranolol that is out of date. She would like to have a refill just in case. She does not report any sudden dizziness or syncope, no chest pain.  I reviewed her ECG today which is normal.  Past Medical History:  Diagnosis Date  . Diverticula of colon   . Palpitations    PACs and PVCs by Holter  . Sarcoidosis ([place]) 1973  . Thyroid nodule    Dr. Caryl Never at [facility]    Current Outpatient Prescriptions  Medication Sig Dispense Refill  . propranolol (INDERAL) 10 MG tablet Take 1 tablet (10 mg total) by mouth 3 (three) times daily as needed. 20 tablet 1   No current facility-administered medications for this visit.    Allergies:  Review of patient's allergies indicates no known allergies.   Social History: The patient  reports that she has never smoked. She has never used smokeless tobacco. She reports that she drinks about 1.2 oz of alcohol per week . She reports that she does not use drugs.   ROS:  Please see the history of present illness. Otherwise, complete review of systems is positive for mainly right sided varicose veins.  All other systems are reviewed and negative.   Physical Exam: VS:  BP 132/86 (BP Location: Left Arm, Patient Position: Sitting, Cuff Size: Normal)   Pulse 81   Ht 5' 10.5" (1.791 m)   Wt  207 lb (93.9 kg)   BMI 29.28 kg/m , BMI Body mass index is 29.28 kg/m.  Wt Readings from Last 3 Encounters:  12/04/15 207 lb (93.9 kg)  12/04/14 211 lb (95.7 kg)  12/24/13 201 lb (91.2 kg)    Appears comfortable at rest.  HEENT: Conjunctiva and lids normal, oropharynx clear.  Neck: Supple, no elevated JVP or carotid bruits, no thyromegaly.  Lungs: Clear to auscultation, nonlabored breathing at rest.  Cardiac: Regular rate and rhythm, no S3 or significant systolic murmur, no pericardial rub.  Extremities: Compression hose on right leg.  Skin: Warm and dry.  ECG: I personally reviewed the tracing from 12/04/2014 which showed sinus rhythm with borderline low voltage.  Other Studies Reviewed Today:  Echocardiogram 04/27/2010 [facility]): reported LVEF 60-65% without significant valvular abnormalities.   Assessment and Plan:  History of palpitations with previously documented PACs and PVCs. She does not report any escalating pattern or intensity of symptoms and has not had to use any propranolol. We did provide a refill in case she needs it. Her ECG is normal today. No further cardiac testing is planned at this time. She would like to continue to follow-up at least on a yearly basis.  Current medicines were reviewed with the patient today.   Orders Placed This Encounter  Procedures  . EKG 12-Lead    Disposition: Follow-up with me in  one year.  Signed, Satira Sark, MD, [facility] 12/04/2015 8:14 AM    [facility] at [place], [place], [place] [address] Phone: [phone]; Fax: [phone]

## 2015-12-04 ENCOUNTER — Ambulatory Visit (INDEPENDENT_AMBULATORY_CARE_PROVIDER_SITE_OTHER): Payer: Medicare Other | Admitting: Cardiology

## 2015-12-04 ENCOUNTER — Encounter: Payer: Self-pay | Admitting: Cardiology

## 2015-12-04 VITALS — BP 132/86 | HR 81 | Ht 70.5 in | Wt 207.0 lb

## 2015-12-04 DIAGNOSIS — R002 Palpitations: Secondary | ICD-10-CM

## 2015-12-04 MED ORDER — PROPRANOLOL HCL 10 MG PO TABS: 10.0000 mg | ORAL_TABLET | Freq: Three times a day (TID) | ORAL | 1 refills | Status: DC | PRN

## 2015-12-04 NOTE — Addendum Note (Signed)
**Note De-Identified Idona Stach Obfuscation** Addended by: Dennie Fetters on: 12/04/2015 08:43 AM   Modules accepted: Orders

## 2015-12-04 NOTE — Patient Instructions (Signed)
Medication Instructions:   Begin Propranolol 10mg  three times per day - AS NEEDED.   Labwork: none  Testing/Procedures: none  Follow-Up: Your physician wants you to follow up in:  1 year.  You will receive a reminder letter in the mail one-two months in advance.  If you don't receive a letter, please call our office to schedule the follow up appointment   Any Other Special Instructions Will Be Listed Below (If Applicable).  If you need a refill on your cardiac medications before your next appointment, please call your pharmacy.

## 2015-12-15 DIAGNOSIS — Z23 Encounter for immunization: Secondary | ICD-10-CM | POA: Diagnosis not present

## 2015-12-24 DIAGNOSIS — H2513 Age-related nuclear cataract, bilateral: Secondary | ICD-10-CM | POA: Diagnosis not present

## 2015-12-24 DIAGNOSIS — H538 Other visual disturbances: Secondary | ICD-10-CM | POA: Diagnosis not present

## 2015-12-29 DIAGNOSIS — I8390 Asymptomatic varicose veins of unspecified lower extremity: Secondary | ICD-10-CM | POA: Diagnosis not present

## 2015-12-29 DIAGNOSIS — Z713 Dietary counseling and surveillance: Secondary | ICD-10-CM | POA: Diagnosis not present

## 2015-12-29 DIAGNOSIS — Z6830 Body mass index (BMI) 30.0-30.9, adult: Secondary | ICD-10-CM | POA: Diagnosis not present

## 2016-01-01 ENCOUNTER — Other Ambulatory Visit: Payer: Self-pay | Admitting: Vascular Surgery

## 2016-01-01 DIAGNOSIS — I872 Venous insufficiency (chronic) (peripheral): Secondary | ICD-10-CM

## 2016-01-28 ENCOUNTER — Encounter: Payer: Self-pay | Admitting: Vascular Surgery

## 2016-01-29 ENCOUNTER — Ambulatory Visit (HOSPITAL_COMMUNITY)
Admission: RE | Admit: 2016-01-29 | Discharge: 2016-01-29 | Disposition: A | Discharge status: Home / Self Care | Payer: Medicare Other | Source: Ambulatory Visit | Attending: Vascular Surgery | Admitting: Vascular Surgery

## 2016-01-29 DIAGNOSIS — I872 Venous insufficiency (chronic) (peripheral): Secondary | ICD-10-CM | POA: Insufficient documentation

## 2016-01-29 DIAGNOSIS — I82890 Acute embolism and thrombosis of other specified veins: Secondary | ICD-10-CM | POA: Insufficient documentation

## 2016-02-02 ENCOUNTER — Ambulatory Visit (INDEPENDENT_AMBULATORY_CARE_PROVIDER_SITE_OTHER): Payer: Medicare Other | Admitting: Vascular Surgery

## 2016-02-02 ENCOUNTER — Encounter: Payer: Self-pay | Admitting: Vascular Surgery

## 2016-02-02 VITALS — BP 128/77 | HR 74 | Temp 97.9°F | Resp 18 | Ht 67.5 in | Wt 212.9 lb

## 2016-02-02 DIAGNOSIS — I83893 Varicose veins of bilateral lower extremities with other complications: Secondary | ICD-10-CM | POA: Diagnosis not present

## 2016-02-02 NOTE — Progress Notes (Signed)
Vascular and Vein Specialist of [place]  Patient name: Danielle Nicholson MRN: [id number] DOB: 1945/05/22 Sex: female  REASON FOR CONSULT: Evaluation of right lower extremity venous pathology  HPI: Danielle Nicholson is a 70 y.o. female, who is seen today for evaluation of lower extremity venous discomfort. She has a remote history of what sounds like radiofrequency ablation from interventional radiology on her right leg. Apparently there was inability to successfully pass the catheter through the ablation vein and therefore was not very successful. She does have a long history of varicosities in her right leg with some tolerable swelling and discomfort associated with this. She had a fall several months ago and has had the discomfort over the varicosities since that time. She does not have any history of DVT. She does have some mild bilateral lower extremity swelling. No evidence of skin change with chronic venous insufficiency changes. She is a retired Music therapist  Past Medical History:  Diagnosis Date  . Diverticula of colon   . Palpitations    PACs and PVCs by Holter  . Sarcoidosis ([place]) 1973  . Thyroid nodule    Dr. Caryl Never at [facility]    Family History  Problem Relation Age of Onset  . Colon cancer Mother     SOCIAL HISTORY: Social History   Social History  . Marital status: Married    Spouse name: N/A  . Number of children: N/A  . Years of education: N/A   Occupational History  . Not on file.   Social History Main Topics  . Smoking status: Never Smoker  . Smokeless tobacco: Never Used  . Alcohol use 1.2 oz/week    2 Glasses of wine per week     Comment: Wine- Weekends ( maybe )  . Drug use: No  . Sexual activity: Not on file   Other Topics Concern  . Not on file   Social History Narrative  . No narrative on file    No Known Allergies  Current Outpatient Prescriptions  Medication Sig Dispense Refill  . propranolol  (INDERAL) 10 MG tablet Take 1 tablet (10 mg total) by mouth 3 (three) times daily as needed. (Patient not taking: Reported on 02/02/2016) 20 tablet 1   No current facility-administered medications for this visit.     REVIEW OF SYSTEMS:  [X]  denotes positive finding, [ ]  denotes negative finding Cardiac  Comments:  Chest pain or chest pressure:    Shortness of breath upon exertion:    Short of breath when lying flat:    Irregular heart rhythm: x       Vascular    Pain in calf, thigh, or hip brought on by ambulation:    Pain in feet at night that wakes you up from your sleep:     Blood clot in your veins:    Leg swelling:  x       Pulmonary    Oxygen at home:    Productive cough:     Wheezing:         Neurologic    Sudden weakness in arms or legs:     Sudden numbness in arms or legs:     Sudden onset of difficulty speaking or slurred speech:    Temporary loss of vision in one eye:     Problems with dizziness:         Gastrointestinal    Blood in stool:     Vomited blood:  Genitourinary    Burning when urinating:     Blood in urine:        Psychiatric    Major depression:         Hematologic    Bleeding problems:    Problems with blood clotting too easily:        Skin    Rashes or ulcers:        Constitutional    Fever or chills:      PHYSICAL EXAM: Vitals:   02/02/16 0929  BP: 128/77  Pulse: 74  Resp: 18  Temp: 97.9 F (36.6 C)  TempSrc: Oral  SpO2: 99%  Weight: 212 lb 14.4 oz (96.6 kg)  Height: 5' 7.5" (1.715 m)    GENERAL: The patient is a well-nourished female, in no acute distress. The vital signs are documented above. CARDIOVASCULAR: 2+ dorsalis pedis pulses laterally. Carotid arteries without bruits. Heart is regular rate and rhythm PULMONARY: There is good air exchange  ABDOMEN: Soft and non-tender  MUSCULOSKELETAL: There are no major deformities or cyanosis. NEUROLOGIC: No focal weakness or paresthesias are detected. SKIN: There  are no ulcers or rashes noted. No changes of hemosiderin deposit in her lower from knee. Does have spider vein telangiectasia scattered on both lower extremities PSYCHIATRIC: The patient has a normal affect. She does have thickening over the lateral aspect with easily palpable thrombosed varicosities in her lateral calf DATA:  Venous duplex from today reveals no evidence of DVT and only mild deep venous reflux in her common femoral vein only. She has a very tortuous saphenous branches throughout her thigh and calf with some reflux throughout these. I imaged this area with SonoSite ultrasound. He does have a segment of proximal thigh which is straight with the remainder these are quite tortuous throughout her knee area and mid to distal thigh  MEDICAL ISSUES: I reassured the patient that she does not have any increased risk for DVT associated with her superficial thrombophlebitis. She reports that the discomfort associated with this is resolving spontaneously. I have recommended that she continue her usual activities. She wears support hose on as-needed basis. Not recommend any additional attempts of treatment since she is having mild symptoms. She was reassured with this discussion will see [place] again on as-needed basis   [name], MD [facility] V[facility] and Vein Specialists of [facility] Tel [phone] Pager [phone]

## 2016-03-07 DIAGNOSIS — Z789 Other specified health status: Secondary | ICD-10-CM | POA: Diagnosis not present

## 2016-03-07 DIAGNOSIS — J069 Acute upper respiratory infection, unspecified: Secondary | ICD-10-CM | POA: Diagnosis not present

## 2016-03-07 DIAGNOSIS — Z299 Encounter for prophylactic measures, unspecified: Secondary | ICD-10-CM | POA: Diagnosis not present

## 2016-03-07 DIAGNOSIS — Z6831 Body mass index (BMI) 31.0-31.9, adult: Secondary | ICD-10-CM | POA: Diagnosis not present

## 2016-03-07 DIAGNOSIS — Z713 Dietary counseling and surveillance: Secondary | ICD-10-CM | POA: Diagnosis not present

## 2016-03-11 DIAGNOSIS — H2512 Age-related nuclear cataract, left eye: Secondary | ICD-10-CM | POA: Diagnosis not present

## 2016-03-11 DIAGNOSIS — H2511 Age-related nuclear cataract, right eye: Secondary | ICD-10-CM | POA: Diagnosis not present

## 2016-03-11 DIAGNOSIS — H278 Other specified disorders of lens: Secondary | ICD-10-CM | POA: Diagnosis not present

## 2016-04-05 DIAGNOSIS — H2513 Age-related nuclear cataract, bilateral: Secondary | ICD-10-CM | POA: Diagnosis not present

## 2016-04-05 DIAGNOSIS — M1990 Unspecified osteoarthritis, unspecified site: Secondary | ICD-10-CM | POA: Diagnosis not present

## 2016-04-05 DIAGNOSIS — H278 Other specified disorders of lens: Secondary | ICD-10-CM | POA: Diagnosis not present

## 2016-04-05 DIAGNOSIS — H527 Unspecified disorder of refraction: Secondary | ICD-10-CM | POA: Diagnosis not present

## 2016-04-05 DIAGNOSIS — H2512 Age-related nuclear cataract, left eye: Secondary | ICD-10-CM | POA: Diagnosis not present

## 2016-04-05 DIAGNOSIS — D869 Sarcoidosis, unspecified: Secondary | ICD-10-CM | POA: Diagnosis not present

## 2016-05-04 DIAGNOSIS — H527 Unspecified disorder of refraction: Secondary | ICD-10-CM | POA: Diagnosis not present

## 2016-08-10 DIAGNOSIS — E041 Nontoxic single thyroid nodule: Secondary | ICD-10-CM | POA: Diagnosis not present

## 2016-08-11 DIAGNOSIS — H25811 Combined forms of age-related cataract, right eye: Secondary | ICD-10-CM | POA: Diagnosis not present

## 2016-08-11 DIAGNOSIS — H527 Unspecified disorder of refraction: Secondary | ICD-10-CM | POA: Diagnosis not present

## 2016-08-11 DIAGNOSIS — H278 Other specified disorders of lens: Secondary | ICD-10-CM | POA: Diagnosis not present

## 2016-08-11 DIAGNOSIS — H40001 Preglaucoma, unspecified, right eye: Secondary | ICD-10-CM | POA: Diagnosis not present

## 2016-08-24 DIAGNOSIS — H40001 Preglaucoma, unspecified, right eye: Secondary | ICD-10-CM | POA: Diagnosis not present

## 2016-09-28 DIAGNOSIS — R5383 Other fatigue: Secondary | ICD-10-CM | POA: Diagnosis not present

## 2016-09-28 DIAGNOSIS — M8580 Other specified disorders of bone density and structure, unspecified site: Secondary | ICD-10-CM | POA: Diagnosis not present

## 2016-09-28 DIAGNOSIS — Z1211 Encounter for screening for malignant neoplasm of colon: Secondary | ICD-10-CM | POA: Diagnosis not present

## 2016-09-28 DIAGNOSIS — Z1231 Encounter for screening mammogram for malignant neoplasm of breast: Secondary | ICD-10-CM | POA: Diagnosis not present

## 2016-09-28 DIAGNOSIS — Z1389 Encounter for screening for other disorder: Secondary | ICD-10-CM | POA: Diagnosis not present

## 2016-09-28 DIAGNOSIS — Z0000 Encounter for general adult medical examination without abnormal findings: Secondary | ICD-10-CM | POA: Diagnosis not present

## 2016-09-28 DIAGNOSIS — Z7189 Other specified counseling: Secondary | ICD-10-CM | POA: Diagnosis not present

## 2016-09-28 DIAGNOSIS — Z6830 Body mass index (BMI) 30.0-30.9, adult: Secondary | ICD-10-CM | POA: Diagnosis not present

## 2016-09-28 DIAGNOSIS — Z299 Encounter for prophylactic measures, unspecified: Secondary | ICD-10-CM | POA: Diagnosis not present

## 2016-09-28 DIAGNOSIS — Z79899 Other long term (current) drug therapy: Secondary | ICD-10-CM | POA: Diagnosis not present

## 2016-09-29 ENCOUNTER — Encounter (INDEPENDENT_AMBULATORY_CARE_PROVIDER_SITE_OTHER): Payer: Self-pay | Admitting: *Deleted

## 2016-10-13 ENCOUNTER — Encounter (INDEPENDENT_AMBULATORY_CARE_PROVIDER_SITE_OTHER): Payer: Self-pay | Admitting: *Deleted

## 2016-10-13 ENCOUNTER — Other Ambulatory Visit (INDEPENDENT_AMBULATORY_CARE_PROVIDER_SITE_OTHER): Payer: Self-pay | Admitting: *Deleted

## 2016-10-13 DIAGNOSIS — Z1211 Encounter for screening for malignant neoplasm of colon: Secondary | ICD-10-CM | POA: Insufficient documentation

## 2016-10-13 DIAGNOSIS — Z800 Family history of malignant neoplasm of digestive organs: Secondary | ICD-10-CM | POA: Insufficient documentation

## 2016-11-01 DIAGNOSIS — E2839 Other primary ovarian failure: Secondary | ICD-10-CM | POA: Diagnosis not present

## 2016-11-07 DIAGNOSIS — Z1231 Encounter for screening mammogram for malignant neoplasm of breast: Secondary | ICD-10-CM | POA: Diagnosis not present

## 2016-12-06 NOTE — Progress Notes (Signed)
Cardiology Office Note  Date: 12/07/2016   ID: Danielle Nicholson, DOB 10-Feb-1946, MRN [id number]  PCP: Glenda Chroman, MD  Primary Cardiologist: Rozann Lesches, MD   Chief Complaint  Patient presents with  . Follow-up palpitations    History of Present Illness: Danielle Nicholson is a 71 y.o. female last seen in September 2017. She presents for a routine follow-up visit. Continues to do very well in retirement, reports much less stress. She reports occasional palpitations but has had no obvious precipitant, no chest pain, no lightheadedness or syncope. She has not had to take any propranolol.  I reviewed her ECG today which shows sinus rhythm with low voltage and R' in lead V1 and V2.  Past Medical History:  Diagnosis Date  . Diverticula of colon   . Palpitations    PACs and PVCs by Holter  . Sarcoidosis 1973  . Thyroid nodule    Dr. Caryl Never at [facility]    Past Surgical History:  Procedure Laterality Date  . ABLATION SAPHENOUS VEIN W/ RFA    . BREAST BIOPSY     right  . Cervical node     left bx  . COLONOSCOPY  11/05/2010   Procedure: COLONOSCOPY;  Surgeon: Rogene [place], MD;  Location: [facility] SUITE;  Service: Endoscopy;  Laterality: N/A;  9:30 am  . HERNIA REPAIR     right  . TOTAL KNEE ARTHROPLASTY  11/02/2011   Procedure: TOTAL KNEE ARTHROPLASTY;  Surgeon: Gearlean Alf, MD;  Location: [facility] ORS;  Service: Orthopedics;  Laterality: Left;    Current Outpatient Prescriptions  Medication Sig Dispense Refill  . propranolol (INDERAL) 10 MG tablet Take 1 tablet (10 mg total) by mouth as needed. 30 tablet 3   No current facility-administered medications for this visit.    Allergies:  Patient has no known allergies.   Social History: The patient  reports that she has never smoked. She has never used smokeless tobacco. She reports that she drinks about 1.2 oz of alcohol per week . She reports that she does not use drugs.   ROS:  Please see the history of present illness.  Otherwise, complete review of systems is positive for none.  All other systems are reviewed and negative.   Physical Exam: VS:  BP 122/80   Pulse 72   Ht 5\' 10"  (1.778 m)   Wt 215 lb (97.5 kg)   SpO2 98%   BMI 30.85 kg/m , BMI Body mass index is 30.85 kg/m.  Wt Readings from Last 3 Encounters:  12/07/16 215 lb (97.5 kg)  02/02/16 212 lb 14.4 oz (96.6 kg)  12/04/15 207 lb (93.9 kg)    General: Patient appears comfortable at rest. HEENT: Conjunctiva and lids normal, oropharynx clear. Neck: Supple, no elevated JVP or carotid bruits, no thyromegaly. Lungs: Clear to auscultation, nonlabored breathing at rest. Cardiac: Regular rate and rhythm, no S3 or significant systolic murmur, no pericardial rub. Abdomen: Soft, nontender, bowel sounds present, no guarding or rebound. Extremities: No pitting edema, distal pulses 2+.  ECG: I personally reviewed the tracing from 12/04/2015 which showed normal sinus rhythm.  Other Studies Reviewed Today:  Echocardiogram 04/27/2010 [facility]): reported LVEF 60-65% without significant valvular abnormalities.   Assessment and Plan:  History of intermittent palpitations with previously documented PACs and PVCs but no sustained arrhythmia. She remains stable with no progression in symptoms, no chest pain or syncope. She has propranolol available to use as needed. I reviewed her follow-up ECG  today. Continue with observation.  Current medicines were reviewed with the patient today.   Orders Placed This Encounter  Procedures  . EKG 12-Lead    Disposition: Follow-up in one year.  Signed, Satira Sark, MD, [facility] 12/07/2016 9:37 AM    [facility] at [place], [place], [place] [address] Phone: [phone]; Fax: [phone]

## 2016-12-07 ENCOUNTER — Encounter: Payer: Self-pay | Admitting: Cardiology

## 2016-12-07 ENCOUNTER — Ambulatory Visit (INDEPENDENT_AMBULATORY_CARE_PROVIDER_SITE_OTHER): Payer: Medicare Other | Admitting: Cardiology

## 2016-12-07 VITALS — BP 122/80 | HR 72 | Ht 70.0 in | Wt 215.0 lb

## 2016-12-07 DIAGNOSIS — R002 Palpitations: Secondary | ICD-10-CM

## 2016-12-07 MED ORDER — PROPRANOLOL HCL 10 MG PO TABS: 10.0000 mg | ORAL_TABLET | ORAL | 3 refills | Status: DC | PRN

## 2016-12-07 NOTE — Patient Instructions (Signed)
Medication Instructions:  Your physician recommends that you continue on your current medications as directed. Please refer to the Current Medication list given to you today.  Labwork: none  Testing/Procedures: none  Follow-Up: Your physician wants you to follow-up in: [place]. You will receive a reminder letter in the mail two months in advance. If you don't receive a letter, please call our office to schedule the follow-up appointment.  Any Other Special Instructions Will Be Listed Below (If Applicable).  If you need a refill on your cardiac medications before your next appointment, please call your pharmacy.

## 2016-12-15 DIAGNOSIS — Z23 Encounter for immunization: Secondary | ICD-10-CM | POA: Diagnosis not present

## 2017-01-03 ENCOUNTER — Telehealth (INDEPENDENT_AMBULATORY_CARE_PROVIDER_SITE_OTHER): Payer: Self-pay | Admitting: *Deleted

## 2017-01-03 ENCOUNTER — Encounter (INDEPENDENT_AMBULATORY_CARE_PROVIDER_SITE_OTHER): Payer: Self-pay | Admitting: *Deleted

## 2017-01-03 NOTE — Telephone Encounter (Signed)
Patient needs osmo pill 

## 2017-01-04 ENCOUNTER — Telehealth (INDEPENDENT_AMBULATORY_CARE_PROVIDER_SITE_OTHER): Payer: Self-pay | Admitting: *Deleted

## 2017-01-04 MED ORDER — PEG 3350-KCL-NA BICARB-NACL 420 G PO SOLR: 4000.0000 mL | Freq: Once | ORAL | 0 refills | Status: AC

## 2017-01-04 MED ORDER — SOD PHOS MONO-SOD PHOS DIBASIC 1.102-0.398 G PO TABS: 32.0000 | ORAL_TABLET | Freq: Once | ORAL | 0 refills | Status: AC

## 2017-01-04 NOTE — Telephone Encounter (Signed)
Patient needs trilyte 

## 2017-01-04 NOTE — Telephone Encounter (Signed)
FYI: Patient sch'd for TCS 02/22/17 -- she requested Osmo pill prep -- advised patient insurance may not cover -- pt called today to let me know it costs 200.00, she wanted to know what was in the pills to make them so expensive, tried to explain some insurance don't cover, her response was "I did not ask you that, what I asked was what ingredient is in them, if you don't now just say I don't know", again explained insurance typically does not cover pill prep, she also told me I need to start telling patients who ask for Osmo that it will cost them 200.00

## 2017-01-05 NOTE — Telephone Encounter (Signed)
NOTED. We have no idea what patient's co-pay would be. If co-pay is too high they can call us before pain for that m

## 2017-01-27 ENCOUNTER — Telehealth (INDEPENDENT_AMBULATORY_CARE_PROVIDER_SITE_OTHER): Payer: Self-pay | Admitting: *Deleted

## 2017-01-27 NOTE — Telephone Encounter (Signed)
Referring MD/PCP: vyas   Procedure: tcs  Reason/Indication:  Screening, fam hx colon ca  Has patient had this procedure before?  Yes, 8-9 yrs ago  If so, when, by whom and where?    Is there a family history of colon cancer?  Yes, mother  Who?  What age when diagnosed?    Is patient diabetic?   no      Does patient have prosthetic heart valve or mechanical valve?  no  Do you have a pacemaker?  no  Has patient ever had endocarditis? no  Has patient had joint replacement within last 12 months?  no  Is patient constipated or take laxatives? no  Does patient have a history of alcohol/drug use?  no  Is patient on Coumadin, Plavix and/or Aspirin? no  Medications: none  Allergies: nkda  Medication Adjustment per Dr Laural Golden:   Procedure date & time: 02/22/17 at 730

## 2017-01-31 NOTE — Telephone Encounter (Signed)
agree

## 2017-02-06 DIAGNOSIS — H40001 Preglaucoma, unspecified, right eye: Secondary | ICD-10-CM | POA: Diagnosis not present

## 2017-02-22 ENCOUNTER — Ambulatory Visit (HOSPITAL_COMMUNITY)
Admission: RE | Admit: 2017-02-22 | Discharge: 2017-02-22 | Disposition: A | Discharge status: Home Health | Payer: Medicare Other | Source: Ambulatory Visit | Attending: Internal Medicine | Admitting: Internal Medicine

## 2017-02-22 ENCOUNTER — Encounter (HOSPITAL_COMMUNITY)
Admission: RE | Disposition: A | Discharge status: Home Health | Payer: Self-pay | Source: Ambulatory Visit | Attending: Internal Medicine

## 2017-02-22 ENCOUNTER — Other Ambulatory Visit: Payer: Self-pay

## 2017-02-22 ENCOUNTER — Encounter (HOSPITAL_COMMUNITY): Payer: Self-pay | Admitting: *Deleted

## 2017-02-22 DIAGNOSIS — Z79899 Other long term (current) drug therapy: Secondary | ICD-10-CM | POA: Diagnosis not present

## 2017-02-22 DIAGNOSIS — Z1211 Encounter for screening for malignant neoplasm of colon: Secondary | ICD-10-CM | POA: Insufficient documentation

## 2017-02-22 DIAGNOSIS — D120 Benign neoplasm of cecum: Secondary | ICD-10-CM | POA: Diagnosis not present

## 2017-02-22 DIAGNOSIS — K6289 Other specified diseases of anus and rectum: Secondary | ICD-10-CM | POA: Diagnosis not present

## 2017-02-22 DIAGNOSIS — Z800 Family history of malignant neoplasm of digestive organs: Secondary | ICD-10-CM | POA: Insufficient documentation

## 2017-02-22 DIAGNOSIS — I491 Atrial premature depolarization: Secondary | ICD-10-CM | POA: Insufficient documentation

## 2017-02-22 DIAGNOSIS — D869 Sarcoidosis, unspecified: Secondary | ICD-10-CM | POA: Diagnosis not present

## 2017-02-22 DIAGNOSIS — I493 Ventricular premature depolarization: Secondary | ICD-10-CM | POA: Diagnosis not present

## 2017-02-22 DIAGNOSIS — K644 Residual hemorrhoidal skin tags: Secondary | ICD-10-CM

## 2017-02-22 DIAGNOSIS — K5730 Diverticulosis of large intestine without perforation or abscess without bleeding: Secondary | ICD-10-CM | POA: Diagnosis not present

## 2017-02-22 DIAGNOSIS — K5720 Diverticulitis of large intestine with perforation and abscess without bleeding: Secondary | ICD-10-CM | POA: Diagnosis not present

## 2017-02-22 HISTORY — PX: COLONOSCOPY: SHX5424

## 2017-02-22 HISTORY — PX: POLYPECTOMY: SHX5525

## 2017-02-22 SURGERY — COLONOSCOPY
Anesthesia: Moderate Sedation

## 2017-02-22 MED ORDER — MEPERIDINE HCL 50 MG/ML IJ SOLN
INTRAMUSCULAR | Status: DC | PRN
  Administered 2017-02-22 (×2): 25 mg via INTRAVENOUS

## 2017-02-22 MED ORDER — STERILE WATER FOR IRRIGATION IR SOLN
Status: DC | PRN
  Administered 2017-02-22: 08:00:00

## 2017-02-22 MED ORDER — MEPERIDINE HCL 50 MG/ML IJ SOLN
INTRAMUSCULAR | Status: AC
  Filled 2017-02-22: qty 1

## 2017-02-22 MED ORDER — LIDOCAINE HCL 2 % EX GEL
CUTANEOUS | Status: DC | PRN
  Administered 2017-02-22: 1 via TOPICAL

## 2017-02-22 MED ORDER — MIDAZOLAM HCL 5 MG/5ML IJ SOLN
INTRAMUSCULAR | Status: AC
  Filled 2017-02-22: qty 10

## 2017-02-22 MED ORDER — SODIUM CHLORIDE 0.9 % IV SOLN
INTRAVENOUS | Status: DC
  Administered 2017-02-22: 07:00:00 via INTRAVENOUS

## 2017-02-22 MED ORDER — MIDAZOLAM HCL 5 MG/5ML IJ SOLN
INTRAMUSCULAR | Status: DC | PRN
  Administered 2017-02-22: 2 mg via INTRAVENOUS
  Administered 2017-02-22: 1 mg via INTRAVENOUS
  Administered 2017-02-22: 2 mg via INTRAVENOUS

## 2017-02-22 MED ORDER — SODIUM CHLORIDE 0.9 % IV SOLN
INTRAVENOUS | Status: AC | PRN
  Administered 2017-02-22: 500 mL via INTRAMUSCULAR

## 2017-02-22 MED ORDER — LIDOCAINE HCL 2 % EX GEL
CUTANEOUS | Status: AC
  Filled 2017-02-22: qty 30

## 2017-02-22 NOTE — Discharge Instructions (Signed)
Resume usual medications as before. High-fiber diet. No driving for 24 hours. Physician will call with biopsy results.   Colonoscopy, Adult, Care After This sheet gives you information about how to care for yourself after your procedure. Your health care provider may also give you more specific instructions. If you have problems or questions, contact your health care provider. What can I expect after the procedure? After the procedure, it is common to have:  A small amount of blood in your stool for 24 hours after the procedure.  Some gas.  Mild abdominal cramping or bloating.  Follow these instructions at home: General instructions   For the first 24 hours after the procedure: ? Do not drive or use machinery. ? Do not sign important documents. ? Do not drink alcohol. ? Do your regular daily activities at a slower pace than normal. ? Eat soft, easy-to-digest foods. ? Rest often.  Take over-the-counter or prescription medicines only as told by your health care provider.  It is up to you to get the results of your procedure. Ask your health care provider, or the department performing the procedure, when your results will be ready. Relieving cramping and bloating  Try walking around when you have cramps or feel bloated.  Apply heat to your abdomen as told by your health care provider. Use a heat source that your health care provider recommends, such as a moist heat pack or a heating pad. ? Place a towel between your skin and the heat source. ? Leave the heat on for 20-30 minutes. ? Remove the heat if your skin turns bright red. This is especially important if you are unable to feel pain, heat, or cold. You may have a greater risk of getting burned. Eating and drinking  Drink enough fluid to keep your urine clear or pale yellow.  Resume your normal diet as instructed by your health care provider. Avoid heavy or fried foods that are hard to digest.  Avoid drinking alcohol for  as long as instructed by your health care provider. Contact a health care provider if:  You have blood in your stool 2-3 days after the procedure. Get help right away if:  You have more than a small spotting of blood in your stool.  You pass large blood clots in your stool.  Your abdomen is swollen.  You have nausea or vomiting.  You have a fever.  You have increasing abdominal pain that is not relieved with medicine. This information is not intended to replace advice given to you by your health care provider. Make sure you discuss any questions you have with your health care provider.   Diverticulosis Diverticulosis is a condition that develops when small pouches (diverticula) form in the wall of the large intestine (colon). The colon is where water is absorbed and stool is formed. The pouches form when the inside layer of the colon pushes through weak spots in the outer layers of the colon. You may have a few pouches or many of them. What are the causes? The cause of this condition is not known. What increases the risk? The following factors may make you more likely to develop this condition:  Being older than age 25. Your risk for this condition increases with age. Diverticulosis is rare among people younger than age 50. By age 64, many people have it.  Eating a low-fiber diet.  Having frequent constipation.  Being overweight.  Not getting enough exercise.  Smoking.  Taking over-the-counter pain medicines, like  aspirin and ibuprofen.  Having a family history of diverticulosis.  What are the signs or symptoms? In most people, there are no symptoms of this condition. If you do have symptoms, they may include:  Bloating.  Cramps in the abdomen.  Constipation or diarrhea.  Pain in the lower left side of the abdomen.  How is this diagnosed? This condition is most often diagnosed during an exam for other colon problems. Because diverticulosis usually has no  symptoms, it often cannot be diagnosed independently. This condition may be diagnosed by:  Using a flexible scope to examine the colon (colonoscopy).  Taking an X-ray of the colon after dye has been put into the colon (barium enema).  Doing a CT scan.  How is this treated? You may not need treatment for this condition if you have never developed an infection related to diverticulosis. If you have had an infection before, treatment may include:  Eating a high-fiber diet. This may include eating more fruits, vegetables, and grains.  Taking a fiber supplement.  Taking a live bacteria supplement (probiotic).  Taking medicine to relax your colon.  Taking antibiotic medicines.  Follow these instructions at home:  Drink 6-8 glasses of water or more each day to prevent constipation.  Try not to strain when you have a bowel movement.  If you have had an infection before: ? Eat more fiber as directed by your health care provider or your diet and nutrition specialist (dietitian). ? Take a fiber supplement or probiotic, if your health care provider approves.  Take over-the-counter and prescription medicines only as told by your health care provider.  If you were prescribed an antibiotic, take it as told by your health care provider. Do not stop taking the antibiotic even if you start to feel better.  Keep all follow-up visits as told by your health care provider. This is important. Contact a health care provider if:  You have pain in your abdomen.  You have bloating.  You have cramps.  You have not had a bowel movement in 3 days. Get help right away if:  Your pain gets worse.  Your bloating becomes very bad.  You have a fever or chills, and your symptoms suddenly get worse.  You vomit.  You have bowel movements that are bloody or black.  You have bleeding from your rectum. Summary  Diverticulosis is a condition that develops when small pouches (diverticula) form in the  wall of the large intestine (colon).  You may have a few pouches or many of them.  This condition is most often diagnosed during an exam for other colon problems.  If you have had an infection related to diverticulosis, treatment may include increasing the fiber in your diet, taking supplements, or taking medicines. This information is not intended to replace advice given to you by your health care provider. Make sure you discuss any questions you have with your health care provider.   Hemorrhoids Hemorrhoids are swollen veins in and around the rectum or anus. There are two types of hemorrhoids:  Internal hemorrhoids. These occur in the veins that are just inside the rectum. They may poke through to the outside and become irritated and painful.  External hemorrhoids. These occur in the veins that are outside of the anus and can be felt as a painful swelling or hard lump near the anus.  Most hemorrhoids do not cause serious problems, and they can be managed with home treatments such as diet and lifestyle changes. If  home treatments do not help your symptoms, procedures can be done to shrink or remove the hemorrhoids. What are the causes? This condition is caused by increased pressure in the anal area. This pressure may result from various things, including:  Constipation.  Straining to have a bowel movement.  Diarrhea.  Pregnancy.  Obesity.  Sitting for long periods of time.  Heavy lifting or other activity that causes you to strain.  Anal sex.  What are the signs or symptoms? Symptoms of this condition include:  Pain.  Anal itching or irritation.  Rectal bleeding.  Leakage of stool (feces).  Anal swelling.  One or more lumps around the anus.  How is this diagnosed? This condition can often be diagnosed through a visual exam. Other exams or tests may also be done, such as:  Examination of the rectal area with a gloved hand (digital rectal exam).  Examination of  the anal canal using a small tube (anoscope).  A blood test, if you have lost a significant amount of blood.  A test to look inside the colon (sigmoidoscopy or colonoscopy).  How is this treated? This condition can usually be treated at home. However, various procedures may be done if dietary changes, lifestyle changes, and other home treatments do not help your symptoms. These procedures can help make the hemorrhoids smaller or remove them completely. Some of these procedures involve surgery, and others do not. Common procedures include:  Rubber band ligation. Rubber bands are placed at the base of the hemorrhoids to cut off the blood supply to them.  Sclerotherapy. Medicine is injected into the hemorrhoids to shrink them.  Infrared coagulation. A type of light energy is used to get rid of the hemorrhoids.  Hemorrhoidectomy surgery. The hemorrhoids are surgically removed, and the veins that supply them are tied off.  Stapled hemorrhoidopexy surgery. A circular stapling device is used to remove the hemorrhoids and use staples to cut off the blood supply to them.  Follow these instructions at home: Eating and drinking  Eat foods that have a lot of fiber in them, such as whole grains, beans, nuts, fruits, and vegetables. Ask your health care provider about taking products that have added fiber (fiber supplements).  Drink enough fluid to keep your urine clear or pale yellow. Managing pain and swelling  Take warm sitz baths for 20 minutes, 3-4 times a day to ease pain and discomfort.  If directed, apply ice to the affected area. Using ice packs between sitz baths may be helpful. ? Put ice in a plastic bag. ? Place a towel between your skin and the bag. ? Leave the ice on for 20 minutes, 2-3 times a day. General instructions  Take over-the-counter and prescription medicines only as told by your health care provider.  Use medicated creams or suppositories as told.  Exercise  regularly.  Go to the bathroom when you have the urge to have a bowel movement. Do not wait.  Avoid straining to have bowel movements.  Keep the anal area dry and clean. Use wet toilet paper or moist towelettes after a bowel movement.  Do not sit on the toilet for long periods of time. This increases blood pooling and pain. Contact a health care provider if:  You have increasing pain and swelling that are not controlled by treatment or medicine.  You have uncontrolled bleeding.  You have difficulty having a bowel movement, or you are unable to have a bowel movement.  You have pain or inflammation outside  the area of the hemorrhoids. This information is not intended to replace advice given to you by your health care provider. Make sure you discuss any questions you have with your health care provider.   High-Fiber Diet Fiber, also called dietary fiber, is a type of carbohydrate found in fruits, vegetables, whole grains, and beans. A high-fiber diet can have many health benefits. Your health care provider may recommend a high-fiber diet to help:  Prevent constipation. Fiber can make your bowel movements more regular.  Lower your cholesterol.  Relieve hemorrhoids, uncomplicated diverticulosis, or irritable bowel syndrome.  Prevent overeating as part of a weight-loss plan.  Prevent heart disease, type 2 diabetes, and certain cancers.  What is my plan? The recommended daily intake of fiber includes:  38 grams for men under age 96.  85 grams for men over age 41.  14 grams for women under age 28.  83 grams for women over age 57.  You can get the recommended daily intake of dietary fiber by eating a variety of fruits, vegetables, grains, and beans. Your health care provider may also recommend a fiber supplement if it is not possible to get enough fiber through your diet. What do I need to know about a high-fiber diet?  Fiber supplements have not been widely studied for their  effectiveness, so it is better to get fiber through food sources.  Always check the fiber content on thenutrition facts label of any prepackaged food. Look for foods that contain at least 5 grams of fiber per serving.  Ask your dietitian if you have questions about specific foods that are related to your condition, especially if those foods are not listed in the following section.  Increase your daily fiber consumption gradually. Increasing your intake of dietary fiber too quickly may cause bloating, cramping, or gas.  Drink plenty of water. Water helps you to digest fiber. What foods can I eat? Grains Whole-grain breads. Multigrain cereal. Oats and oatmeal. Brown rice. Barley. Bulgur wheat. [place]. Bran muffins. Popcorn. Rye wafer crackers. Vegetables Sweet potatoes. Spinach. Kale. Artichokes. Cabbage. Broccoli. Green peas. Carrots. Squash. Fruits Berries. Pears. Apples. Oranges. Avocados. Prunes and raisins. Dried figs. Meats and Other Protein Sources [place], kidney, pinto, and soy beans. Split peas. Lentils. Nuts and seeds. Dairy Fiber-fortified yogurt. Beverages Fiber-fortified soy milk. Fiber-fortified orange juice. Other Fiber bars. The items listed above may not be a complete list of recommended foods or beverages. Contact your dietitian for more options. What foods are not recommended? Grains White bread. Pasta made with refined flour. White rice. Vegetables Fried potatoes. Canned vegetables. Well-cooked vegetables. Fruits Fruit juice. Cooked, strained fruit. Meats and Other Protein Sources Fatty cuts of meat. Fried Sales executive or fried fish. Dairy Milk. Yogurt. Cream cheese. Sour cream. Beverages Soft drinks. Other Cakes and pastries. Butter and oils. The items listed above may not be a complete list of foods and beverages to avoid. Contact your dietitian for more information. What are some tips for including high-fiber foods in my diet?  Eat a wide variety of  high-fiber foods.  Make sure that half of all grains consumed each day are whole grains.  Replace breads and cereals made from refined flour or white flour with whole-grain breads and cereals.  Replace white rice with brown rice, bulgur wheat, or millet.  Start the day with a breakfast that is high in fiber, such as a cereal that contains at least 5 grams of fiber per serving.  Use beans in place of meat in  soups, salads, or pasta.  Eat high-fiber snacks, such as berries, raw vegetables, nuts, or popcorn. This information is not intended to replace advice given to you by your health care provider. Make sure you discuss any questions you have with your health care provider.   Colon Polyps Polyps are tissue growths inside the body. Polyps can grow in many places, including the large intestine (colon). A polyp may be a round bump or a mushroom-shaped growth. You could have one polyp or several. Most colon polyps are noncancerous (benign). However, some colon polyps can become cancerous over time. What are the causes? The exact cause of colon polyps is not known. What increases the risk? This condition is more likely to develop in people who:  Have a family history of colon cancer or colon polyps.  Are older than 69 or older than 45 if they are African American.  Have inflammatory bowel disease, such as ulcerative colitis or Crohn disease.  Are overweight.  Smoke cigarettes.  Do not get enough exercise.  Drink too much alcohol.  Eat a diet that is: ? High in fat and red meat. ? Low in fiber.  Had childhood cancer that was treated with abdominal radiation.  What are the signs or symptoms? Most polyps do not cause symptoms. If you have symptoms, they may include:  Blood coming from your rectum when having a bowel movement.  Blood in your stool.The stool may look dark red or black.  A change in bowel habits, such as constipation or diarrhea.  How is this  diagnosed? This condition is diagnosed with a colonoscopy. This is a procedure that uses a lighted, flexible scope to look at the inside of your colon. How is this treated? Treatment for this condition involves removing any polyps that are found. Those polyps will then be tested for cancer. If cancer is found, your health care provider will talk to you about options for colon cancer treatment. Follow these instructions at home: Diet  Eat plenty of fiber, such as fruits, vegetables, and whole grains.  Eat foods that are high in calcium and vitamin D, such as milk, cheese, yogurt, eggs, liver, fish, and broccoli.  Limit foods high in fat, red meats, and processed meats, such as hot dogs, sausage, bacon, and lunch meats.  Maintain a healthy weight, or lose weight if recommended by your health care provider. General instructions  Do not smoke cigarettes.  Do not drink alcohol excessively.  Keep all follow-up visits as told by your health care provider. This is important. This includes keeping regularly scheduled colonoscopies. Talk to your health care provider about when you need a colonoscopy.  Exercise every day or as told by your health care provider. Contact a health care provider if:  You have new or worsening bleeding during a bowel movement.  You have new or increased blood in your stool.  You have a change in bowel habits.  You unexpectedly lose weight. This information is not intended to replace advice given to you by your health care provider. Make sure you discuss any questions you have with your health care provider.

## 2017-02-22 NOTE — Op Note (Signed)
[facility] Patient Name: Danielle Nicholson Procedure Date: 02/22/2017 7:11 AM MRN: [id number] Date of Birth: 23-Feb-1946 Attending MD: [name] , MD CSN: [id number] Age: 71 Admit Type: Outpatient Procedure:                Colonoscopy Indications:              Screening in patient at increased risk: Colorectal                            cancer in mother 87 or older Providers:                [name], MD, Otis Peak B. Sharon Seller, RN, Starla Link RN, RN Referring MD:             Glenda Chroman, MD Medicines:                Meperidine 50 mg IV, Midazolam 5 mg IV Complications:            No immediate complications. Estimated Blood Loss:     Estimated blood loss was minimal. Procedure:                Pre-Anesthesia Assessment:                           - Prior to the procedure, a History and Physical                            was performed, and patient medications and                            allergies were reviewed. The patient's tolerance of                            previous anesthesia was also reviewed. The risks                            and benefits of the procedure and the sedation                            options and risks were discussed with the patient.                            All questions were answered, and informed consent                            was obtained. Prior Anticoagulants: The patient has                            taken no previous anticoagulant or antiplatelet                            agents. ASA Grade Assessment: I - A normal, healthy  patient. After reviewing the risks and benefits,                            the patient was deemed in satisfactory condition to                            undergo the procedure.                           After obtaining informed consent, the colonoscope                            was passed under direct vision. Throughout the   procedure, the patient's blood pressure, pulse, and                            oxygen saturations were monitored continuously. The                            EC-[id number] ([id number]) scope was introduced through                            the anus and advanced to the the cecum, identified                            by appendiceal orifice and ileocecal valve. The                            colonoscopy was performed without difficulty. The                            patient tolerated the procedure well. The quality                            of the bowel preparation was excellent. The                            ileocecal valve, appendiceal orifice, and rectum                            were photographed. Scope In: 7:39:58 AM Scope Out: 8:01:02 AM Scope Withdrawal Time: 0 hours 9 minutes 51 seconds  Total Procedure Duration: 0 hours 21 minutes 4 seconds  Findings:      The perianal and digital rectal examinations were normal.      A small polyp was found in the cecum. The polyp was sessile. Biopsies       were taken with a cold forceps for histology.      Multiple small and large-mouthed diverticula were found in the sigmoid       colon and descending colon.      External hemorrhoids were found during retroflexion. The hemorrhoids       were small. Impression:               - One small polyp in the cecum. Biopsied.                           -  Diverticulosis in the sigmoid colon and in the                            descending colon.                           - External hemorrhoids. Moderate Sedation:      Moderate (conscious) sedation was administered by the endoscopy nurse       and supervised by the endoscopist. The following parameters were       monitored: oxygen saturation, heart rate, blood pressure, CO2       capnography and response to care. Total physician intraservice time was       25 minutes. Recommendation:           - Patient has a contact number available for                             emergencies. The signs and symptoms of potential                            delayed complications were discussed with the                            patient. Return to normal activities tomorrow.                            Written discharge instructions were provided to the                            patient.                           - High fiber diet today.                           - Continue present medications.                           - Await pathology results.                           - Repeat colonoscopy date to be determined after                            pending pathology results are reviewed. Procedure Code(s):        --- Professional ---                           [phone], Colonoscopy, flexible; with biopsy, single                            or multiple                           99152, Moderate sedation services provided by the  same physician or other qualified health care                            professional performing the diagnostic or                            therapeutic service that the sedation supports,                            requiring the presence of an independent trained                            observer to assist in the monitoring of the                            patient's level of consciousness and physiological                            status; initial 15 minutes of intraservice time,                            patient age 82 years or older                           [phone], Moderate sedation services; each additional                            15 minutes intraservice time Diagnosis Code(s):        --- Professional ---                           Z80.0, Family history of malignant neoplasm of                            digestive organs                           D12.0, Benign neoplasm of cecum                           K64.4, Residual hemorrhoidal skin tags                           K62.89, Other specified diseases of  anus and rectum                           K57.30, Diverticulosis of large intestine without                            perforation or abscess without bleeding CPT copyright 2016 [employer]. All rights reserved. The codes documented in this report are preliminary and upon coder review may  be revised to meet current compliance requirements. [name], MD [name], MD 02/22/2017 8:10:36 AM This report has been signed electronically. Number of Addenda: 0

## 2017-02-22 NOTE — H&P (Signed)
Danielle Nicholson is an 71 y.o. female.   Chief Complaint: Patient is here for colonoscopy. HPI: Patient is 71 year old Caucasian female who is here for screening colonoscopy.  She denies abdominal pain change in bowel habits or rectal bleeding.  Family history of CRC in mother who was around 99 at the time of diagnosis.  Past Medical History:  Diagnosis Date  . Diverticula of colon   . Palpitations    PACs and PVCs by Holter  . Sarcoidosis 1973  . Thyroid nodule    Dr. Caryl Never at [facility]    Past Surgical History:  Procedure Laterality Date  . ABLATION SAPHENOUS VEIN W/ RFA    . BREAST BIOPSY     right  . Cervical node     left bx  . COLONOSCOPY  11/05/2010   Procedure: COLONOSCOPY;  Surgeon: Rogene Houston, MD;  Location: [facility];  Service: Endoscopy;  Laterality: N/A;  9:30 am  . HERNIA REPAIR     right  . TOTAL KNEE ARTHROPLASTY  11/02/2011   Procedure: TOTAL KNEE ARTHROPLASTY;  Surgeon: Gearlean Alf, MD;  Location: [facility];  Service: Orthopedics;  Laterality: Left;    Family History  Problem Relation Age of Onset  . Colon cancer Mother    Social History:  reports that  has never smoked. she has never used smokeless tobacco. She reports that she drinks about 1.2 oz of alcohol per week. She reports that she does not use drugs.  Allergies: No Known Allergies  Medications Prior to Admission  Medication Sig Dispense Refill  . acetaminophen (TYLENOL) 650 MG CR tablet Take 650 mg daily as needed by mouth for pain.    . Ca Carbonate-Mag Hydroxide (ROLAIDS PO) Take 1 tablet daily as needed by mouth (acid reflux).    . propranolol (INDERAL) 10 MG tablet Take 1 tablet (10 mg total) by mouth as needed. (Patient taking differently: Take 10 mg as needed by mouth (random irregular heartbeat). ) 30 tablet 3    No results found for this or any previous visit (from the past 48 hour(s)). No results found.  ROS  Blood pressure 139/72, pulse 79, temperature 97.8 F (36.6 C),  temperature source Oral, resp. rate 17, height 5' 10.5" (1.791 m), weight 192 lb (87.1 kg), SpO2 98 %. Physical Exam  Constitutional: She appears well-developed and well-nourished.  HENT:  Mouth/Throat: Oropharynx is clear and moist.  Eyes: Conjunctivae are normal. No scleral icterus.  Neck: No thyromegaly present.  Cardiovascular: Normal rate, regular rhythm and normal heart sounds.  No murmur heard. Respiratory: Effort normal and breath sounds normal.  GI: Soft. She exhibits no distension and no mass. There is no tenderness.  Musculoskeletal: She exhibits no edema.  Lymphadenopathy:    She has no cervical adenopathy.  Neurological: She is alert.  Skin: Skin is warm and dry.     Assessment/Plan  High risk screening colonoscopy. Family history of CRC in first-degree relative.  [name], MD 02/22/2017, 7:30 AM

## 2017-02-27 ENCOUNTER — Encounter (HOSPITAL_COMMUNITY): Payer: Self-pay | Admitting: Internal Medicine

## 2017-08-14 DIAGNOSIS — E041 Nontoxic single thyroid nodule: Secondary | ICD-10-CM | POA: Diagnosis not present

## 2017-08-17 DIAGNOSIS — H25811 Combined forms of age-related cataract, right eye: Secondary | ICD-10-CM | POA: Diagnosis not present

## 2017-08-17 DIAGNOSIS — H527 Unspecified disorder of refraction: Secondary | ICD-10-CM | POA: Diagnosis not present

## 2017-08-17 DIAGNOSIS — H40001 Preglaucoma, unspecified, right eye: Secondary | ICD-10-CM | POA: Diagnosis not present

## 2017-09-07 ENCOUNTER — Telehealth: Payer: Self-pay | Admitting: Cardiology

## 2017-09-07 NOTE — Telephone Encounter (Signed)
Pt c/o skipped beats/palpitations varies from lasting a few minutes to several hours says HR flucuates from 66-106 - denies CP/dizziness/SOB/swelling - has not taken propanolol - concerned she needs a sooner appt or nurse visit for EKG - pt aware that may not get an answer tonight and if symptoms worsen to report to [facility] ED

## 2017-09-07 NOTE — Telephone Encounter (Signed)
Would use her propranolol as needed. See if she can get a nurse visit with ECG tomorrow.

## 2017-09-07 NOTE — Telephone Encounter (Signed)
Pt aware and will come in for EKG tomorrow at 9

## 2017-09-07 NOTE — Telephone Encounter (Signed)
Patient called stating that for several days now she is having rapid heart rate. She is calling to see if she can come in and get EKG,.   Please call [phone].

## 2017-09-08 ENCOUNTER — Ambulatory Visit (INDEPENDENT_AMBULATORY_CARE_PROVIDER_SITE_OTHER): Payer: Medicare Other | Admitting: *Deleted

## 2017-09-08 ENCOUNTER — Ambulatory Visit: Payer: Medicare Other | Admitting: *Deleted

## 2017-09-08 DIAGNOSIS — R002 Palpitations: Secondary | ICD-10-CM | POA: Diagnosis not present

## 2017-09-08 NOTE — Progress Notes (Signed)
Pt here for EKG per yesterday's phone note. Says last night pulse was ranging from 80s-120s with heaviness in her chest until early this morning. Pt denies any symptoms at this time. Says she did take propanolol last night and doesn't think this helped much - EKG scanned into chart and will forward to provider if monitor needed

## 2017-09-08 NOTE — Progress Notes (Signed)
Pt aware and would like to wear 48 hour holter - will come in this afternoon so that she can wear over the weekend

## 2017-09-08 NOTE — Addendum Note (Signed)
Addended by: Julian Hy T on: 09/08/2017 02:24 PM   Modules accepted: Orders

## 2017-09-08 NOTE — Progress Notes (Signed)
If she feels like her symptoms are more frequent than usual and that propranolol has not been effective, we could consider placing a 24 to 48-hour Holter monitor.  In the past she has had documented PACs and PVCs but no arrhythmias.

## 2017-09-14 ENCOUNTER — Encounter (INDEPENDENT_AMBULATORY_CARE_PROVIDER_SITE_OTHER): Payer: Medicare Other

## 2017-09-14 DIAGNOSIS — R002 Palpitations: Secondary | ICD-10-CM

## 2017-09-15 ENCOUNTER — Telehealth: Payer: Self-pay

## 2017-09-15 ENCOUNTER — Telehealth: Payer: Self-pay | Admitting: Cardiology

## 2017-09-15 MED ORDER — METOPROLOL SUCCINATE ER 25 MG PO TB24: 12.5000 mg | ORAL_TABLET | Freq: Every day | ORAL | 0 refills | Status: DC

## 2017-09-15 NOTE — Telephone Encounter (Signed)
Patient called asking for results 

## 2017-09-15 NOTE — Telephone Encounter (Signed)
-----   Message from Satira Sark, MD sent at 09/15/2017  9:07 AM EDT ----- Results reviewed.  I went over her monitor.  Other than rare PVCs and occasional PACs, she did have some bursts of atrial tachycardia that she might be feeling in terms of palpitations.  See if she would consider starting on either low-dose Toprol XL or atenolol at 12.5 mg daily.  This may be more effective than propranolol. A copy of this test should be forwarded to Glenda Chroman, MD.

## 2017-09-15 NOTE — Telephone Encounter (Signed)
Patient notified. Patient states she does not feel comfortable starting any new medication without speaking to Dr. Domenic Polite first. Appointment scheduled with Dr. Domenic Polite on 7/1 in [place]. Routed to PCP

## 2017-09-15 NOTE — Telephone Encounter (Signed)
Patient given results

## 2017-09-15 NOTE — Telephone Encounter (Signed)
Patient changed her mind and decided to start toprol xl, but still would like to keep appointment on 7/1 with Dr. Domenic Polite to discuss meds and results. Rx sent to [facility]

## 2017-09-15 NOTE — Addendum Note (Signed)
Addended by: Acquanetta Chain on: 09/15/2017 10:28 AM   Modules accepted: Orders

## 2017-09-28 ENCOUNTER — Telehealth: Payer: Self-pay | Admitting: Cardiology

## 2017-09-28 ENCOUNTER — Ambulatory Visit (INDEPENDENT_AMBULATORY_CARE_PROVIDER_SITE_OTHER): Payer: Medicare Other | Admitting: Cardiology

## 2017-09-28 ENCOUNTER — Emergency Department (HOSPITAL_COMMUNITY): Payer: Medicare Other

## 2017-09-28 ENCOUNTER — Emergency Department (HOSPITAL_COMMUNITY)
Admission: EM | Admit: 2017-09-28 | Discharge: 2017-09-28 | Disposition: A | Discharge status: Home / Self Care | Payer: Medicare Other | Attending: Emergency Medicine | Admitting: Emergency Medicine

## 2017-09-28 ENCOUNTER — Encounter: Payer: Self-pay | Admitting: Cardiology

## 2017-09-28 ENCOUNTER — Ambulatory Visit (INDEPENDENT_AMBULATORY_CARE_PROVIDER_SITE_OTHER): Payer: Medicare Other | Admitting: *Deleted

## 2017-09-28 ENCOUNTER — Other Ambulatory Visit: Payer: Self-pay

## 2017-09-28 ENCOUNTER — Encounter (HOSPITAL_COMMUNITY): Payer: Self-pay | Admitting: Emergency Medicine

## 2017-09-28 VITALS — BP 142/90

## 2017-09-28 DIAGNOSIS — Z96652 Presence of left artificial knee joint: Secondary | ICD-10-CM | POA: Diagnosis not present

## 2017-09-28 DIAGNOSIS — I471 Supraventricular tachycardia: Secondary | ICD-10-CM

## 2017-09-28 DIAGNOSIS — I480 Paroxysmal atrial fibrillation: Secondary | ICD-10-CM | POA: Diagnosis not present

## 2017-09-28 DIAGNOSIS — I4891 Unspecified atrial fibrillation: Secondary | ICD-10-CM

## 2017-09-28 DIAGNOSIS — R002 Palpitations: Secondary | ICD-10-CM

## 2017-09-28 DIAGNOSIS — Z7982 Long term (current) use of aspirin: Secondary | ICD-10-CM | POA: Insufficient documentation

## 2017-09-28 DIAGNOSIS — R0602 Shortness of breath: Secondary | ICD-10-CM | POA: Diagnosis not present

## 2017-09-28 DIAGNOSIS — R000 Tachycardia, unspecified: Secondary | ICD-10-CM | POA: Diagnosis not present

## 2017-09-28 DIAGNOSIS — E049 Nontoxic goiter, unspecified: Secondary | ICD-10-CM | POA: Insufficient documentation

## 2017-09-28 DIAGNOSIS — T7840XA Allergy, unspecified, initial encounter: Secondary | ICD-10-CM

## 2017-09-28 DIAGNOSIS — T887XXA Unspecified adverse effect of drug or medicament, initial encounter: Secondary | ICD-10-CM | POA: Diagnosis not present

## 2017-09-28 DIAGNOSIS — R079 Chest pain, unspecified: Secondary | ICD-10-CM | POA: Diagnosis not present

## 2017-09-28 LAB — TSH: TSH: 3.063 u[IU]/mL (ref 0.350–4.500)

## 2017-09-28 LAB — CBC
HEMATOCRIT: 44.6 % (ref 36.0–46.0)
HEMOGLOBIN: 14.6 g/dL (ref 12.0–15.0)
MCH: 31.1 pg (ref 26.0–34.0)
MCHC: 32.7 g/dL (ref 30.0–36.0)
MCV: 94.9 fL (ref 78.0–100.0)
Platelets: 282 10*3/uL (ref 150–400)
RBC: 4.7 MIL/uL (ref 3.87–5.11)
RDW: 12.6 % (ref 11.5–15.5)
WBC: 9.2 10*3/uL (ref 4.0–10.5)

## 2017-09-28 LAB — BASIC METABOLIC PANEL
Anion gap: 8 (ref 5–15)
BUN: 17 mg/dL (ref 8–23)
CHLORIDE: 104 mmol/L (ref 98–111)
CO2: 27 mmol/L (ref 22–32)
Calcium: 9.5 mg/dL (ref 8.9–10.3)
Creatinine, Ser: 0.83 mg/dL (ref 0.44–1.00)
Glucose, Bld: 145 mg/dL — ABNORMAL HIGH (ref 70–99)
POTASSIUM: 3.5 mmol/L (ref 3.5–5.1)
Sodium: 139 mmol/L (ref 135–145)

## 2017-09-28 LAB — TROPONIN I

## 2017-09-28 LAB — MAGNESIUM: MAGNESIUM: 2.1 mg/dL (ref 1.7–2.4)

## 2017-09-28 MED ORDER — DILTIAZEM LOAD VIA INFUSION
20.0000 mg | Freq: Once | INTRAVENOUS | Status: AC
  Administered 2017-09-28: 20 mg via INTRAVENOUS
  Filled 2017-09-28: qty 20

## 2017-09-28 MED ORDER — METOPROLOL TARTRATE 5 MG/5ML IV SOLN
5.0000 mg | Freq: Once | INTRAVENOUS | Status: AC
  Administered 2017-09-28: 5 mg via INTRAVENOUS
  Filled 2017-09-28: qty 5

## 2017-09-28 MED ORDER — METOPROLOL SUCCINATE ER 25 MG PO TB24
25.0000 mg | ORAL_TABLET | Freq: Every day | ORAL | Status: DC
  Administered 2017-09-28: 25 mg via ORAL
  Filled 2017-09-28 (×3): qty 1

## 2017-09-28 MED ORDER — DILTIAZEM HCL-DEXTROSE 100-5 MG/100ML-% IV SOLN (PREMIX)
5.0000 mg/h | INTRAVENOUS | Status: DC
  Filled 2017-09-28: qty 100

## 2017-09-28 MED ORDER — METOPROLOL SUCCINATE ER 25 MG PO TB24: 25.0000 mg | ORAL_TABLET | Freq: Every day | ORAL | 0 refills | Status: DC

## 2017-09-28 MED ORDER — DIPHENHYDRAMINE HCL 50 MG/ML IJ SOLN
25.0000 mg | Freq: Once | INTRAMUSCULAR | Status: AC
  Administered 2017-09-28: 25 mg via INTRAVENOUS
  Filled 2017-09-28: qty 1

## 2017-09-28 MED ORDER — APIXABAN 5 MG PO TABS: 5.0000 mg | ORAL_TABLET | Freq: Two times a day (BID) | ORAL | 0 refills | Status: DC

## 2017-09-28 MED ORDER — APIXABAN 5 MG PO TABS
5.0000 mg | ORAL_TABLET | Freq: Once | ORAL | Status: AC
  Administered 2017-09-28: 5 mg via ORAL
  Filled 2017-09-28 (×2): qty 1

## 2017-09-28 MED ORDER — SODIUM CHLORIDE 0.9 % IV BOLUS
1000.0000 mL | Freq: Once | INTRAVENOUS | Status: AC
  Administered 2017-09-28: 1000 mL via INTRAVENOUS

## 2017-09-28 MED ORDER — SODIUM CHLORIDE 0.9 % IV SOLN
INTRAVENOUS | Status: DC
  Administered 2017-09-28: 20:00:00 via INTRAVENOUS

## 2017-09-28 NOTE — Discharge Instructions (Addendum)
You have an allergy to cardizem.  This allergy was put in your chart on Epic.

## 2017-09-28 NOTE — Progress Notes (Signed)
Pt says about 30 mins ago c/o "rapid tachycardia" and "skipping beats" and chest pressure -  HR reached 138 - had been compliant on Toprol XL 12.5 mg at bedtime - denies SOB/chest pain/dizziness - BP is 142/90 - EKG done and will be given to provider

## 2017-09-28 NOTE — Telephone Encounter (Signed)
Nurse visit in progress.

## 2017-09-28 NOTE — ED Notes (Signed)
Hr in 150s prior to cardizem, in 130s with bolus of cardizem.

## 2017-09-28 NOTE — Patient Instructions (Signed)
Medication Instructions:  Your physician recommends that you continue on your current medications as directed. Please refer to the Current Medication list given to you today.  Labwork: none  Testing/Procedures: none  Follow-Up: You have been referred to ELECTROPHYSIOLOGY ASAP   Any Other Special Instructions Will Be Listed Below (If Applicable).  If you need a refill on your cardiac medications before your next appointment, please call your pharmacy.

## 2017-09-28 NOTE — Progress Notes (Signed)
Cardiology Office Note  Date: 09/28/2017   ID: Danielle Nicholson, Danielle Nicholson 05-04-1945, MRN [id number]  PCP: Glenda Chroman, MD  Primary Cardiologist: Rozann Lesches, MD   Chief Complaint  Patient presents with  . Palpitations    History of Present Illness: Danielle Nicholson is a 72 y.o. female last seen in September 2018.  She walked in to the office today complaining of persistent palpitations that started about 20 minutes prior to her arrival.  She has had more frequent intermittent palpitations in the last several weeks, wore a cardiac monitor that showed only PACs and PVCs as well as a few brief episodes of what looked to be atrial tachycardia.  She was started on Toprol-XL at 12.5 mg daily she has been taking regularly.  She does not report any chest pain and has had no syncope.  I personally reviewed her ECG today which shows an SVT that is largely regular at 155 bpm, possibly atypical atrial flutter or an atrial tachycardia.  I cannot completely exclude atrial fibrillation.  Right carotid sinus massage was performed in the office without much effect, although the heart rate did slow very briefly.  She does not have any prior history of sustained arrhythmia.  Past Medical History:  Diagnosis Date  . Diverticula of colon   . Palpitations    PACs and PVCs by Holter  . Sarcoidosis 1973  . Thyroid nodule    Dr. Caryl Never at [facility]    Past Surgical History:  Procedure Laterality Date  . ABLATION SAPHENOUS VEIN W/ RFA    . BREAST BIOPSY     right  . Cervical node     left bx  . COLONOSCOPY  11/05/2010   Procedure: COLONOSCOPY;  Surgeon: Rogene Houston, MD;  [facility]: [facility];  Service: Endoscopy;  Laterality: N/A;  9:30 am  . COLONOSCOPY N/A 02/22/2017   Procedure: COLONOSCOPY;  Surgeon: Rogene Houston, MD;  Location: [facility];  Service: Endoscopy;  Laterality: N/A;  730  . HERNIA REPAIR     right  . POLYPECTOMY  02/22/2017   Procedure: POLYPECTOMY;  Surgeon: Rogene Houston, MD;  Location: [facility];  Service: Endoscopy;;  colon   . TOTAL KNEE ARTHROPLASTY  11/02/2011   Procedure: TOTAL KNEE ARTHROPLASTY;  Surgeon: Gearlean Alf, MD;  Location: [facility];  Service: Orthopedics;  Laterality: Left;    Current Outpatient Medications  Medication Sig Dispense Refill  . acetaminophen (TYLENOL) 650 MG CR tablet Take 650 mg daily as needed by mouth for pain.    . Ca Carbonate-Mag Hydroxide (ROLAIDS PO) Take 1 tablet daily as needed by mouth (acid reflux).    . metoprolol succinate (TOPROL XL) 25 MG 24 hr tablet Take 0.5 tablets (12.5 mg total) by mouth daily. 45 tablet 0   No current facility-administered medications for this visit.    Allergies:  Patient has no known allergies.   Social History: The patient  reports that she has never smoked. She has never used smokeless tobacco. She reports that she drinks about 1.2 oz of alcohol per week. She reports that she does not use drugs.   Family History: The patient's family history includes Colon cancer in her mother.   ROS:  Please see the history of present illness. Otherwise, complete review of systems is positive for none.  All other systems are reviewed and negative.   Physical Exam: VS:  BP (!) 142/90 , BMI There is no height  or weight on file to calculate BMI.  Wt Readings from Last 3 Encounters:  02/22/17 192 lb (87.1 kg)  12/07/16 215 lb (97.5 kg)  02/02/16 212 lb 14.4 oz (96.6 kg)    General: Patient is in no acute distress. HEENT: Conjunctiva and lids normal, oropharynx clear. Neck: Supple, no elevated JVP or carotid bruits, no thyromegaly. Lungs: Clear to auscultation, nonlabored breathing at rest. Cardiac: Rapid largely regular rhythm with intermittent irregularity, no S3 or significant systolic murmur, no pericardial rub. Abdomen: Soft, nontender, bowel sounds present. Extremities: No pitting edema, distal pulses 2+. Skin: Warm and dry. Musculoskeletal: No kyphosis. Neuropsychiatric:  Alert and oriented x3, affect grossly appropriate.  ECG: I personally reviewed the tracing from 09/08/2017 which showed sinus rhythm with low voltage.  Other Studies Reviewed Today:  Echocardiogram 04/27/2010 [facility]): reported LVEF 60-65% without significant valvular abnormalities.   Assessment and Plan:  Patient presents with recent onset rapid palpitations that have persisted.  She has no previously documented history of sustained cardiac arrhythmia.  Recent cardiac monitor shows PACs and PVCs which she has had in the past, and also brief episodes of what looks to be an atrial tachycardia.  Her ECG today is more suggestive of an atypical atrial flutter, I cannot completely exclude atrial fibrillation at the current rate but rhythm is largely regular.  She did not respond to carotid sinus massage.  She is hemodynamically stable at this time, feels somewhat jittery.  No active chest pain or ischemic ST segment changes.  She has been taking Toprol-XL 12.5 mg daily.  Plan at this time is to have her husband take her to [facility], ER so that she can be placed on a monitor and be given IV rate control medications (Verapamil or Lopressor) as this may help to lead to spontaneous conversion to sinus rhythm.  May also further elucidate underlying rhythm.  If she does convert with rate control, she can likely be discharged for continued evaluation as an outpatient.  I would increase her standing beta-blocker dose and have her follow-up with an echocardiogram.  She already has a visit scheduled to see me next week.  We also discussed referring her for EP consultation.  If her rhythm persists despite heart rate control and she requires hospital observation then our cardiology consultation service can see her as well.  Current medicines were reviewed with the patient today.   Orders Placed This Encounter  Procedures  . Ambulatory referral to Cardiac Electrophysiology    Disposition: Keep scheduled  follow-up for next week.  Signed, Satira Sark, MD, [facility] 09/28/2017 4:03 PM    [place] [facility] [address], [place], [place] [address] Phone: [phone]; Fax: [phone]

## 2017-09-28 NOTE — Telephone Encounter (Signed)
Walked into the office stating that she feels like she is having another spell. States that her heart is racing.

## 2017-09-28 NOTE — ED Triage Notes (Signed)
Patient c/o tachycardia and "jitterness. Patient recently being treated for tachycardia with metoprolol 12.5mg . Patient states started feeling hart race today while eating lunch. Patient seen at Dr Domenic Polite today and heart rate was 150s. Denies any chest pain or shortness of breath.

## 2017-09-28 NOTE — ED Provider Notes (Signed)
[facility] EMERGENCY DEPARTMENT Provider Note   CSN: [id number] Arrival date & time: 09/28/17  1629     History   Chief Complaint Chief Complaint  Patient presents with  . Tachycardia    HPI Danielle Nicholson is a 72 y.o. female.  Pt presents to the ED from Dr. Myles Gip office in [place].  Pt had been having palpitations on and off for the past several weeks.  She wore a Holter monitor on 6/7 to 6/9.  The monitor showed some PACs and PVCs and a few brief episodes of atrial tachycardia.  She was started on Toprol-XL 12.5 mg which she's been taking regularly.  Pt felt her heart start to beat very fast around 1500.  She presented to Dr. Myles Gip office with HR at 155.  The rhythm was not clear.  No hx of a.fib.  No blood thinners.  They tried right carotid sinus massage without effect.  He sent her here for rate control and possible conversion.  He did make an outpatient referral to EP.  CHA2DS2/VAS Score 2 (1 for female, 1 for age)     Past Medical History:  Diagnosis Date  . Diverticula of colon   . Palpitations    PACs and PVCs by Holter  . Sarcoidosis 1973  . Tachycardia   . Thyroid nodule    Dr. Caryl Never at [facility]    Patient Active Problem List   Diagnosis Date Noted  . Special screening for malignant neoplasms, colon 10/13/2016  . Family hx of colon cancer 10/13/2016  . SARCOIDOSIS 04/21/2010  . Palpitations 04/21/2010    Past Surgical History:  Procedure Laterality Date  . ABLATION SAPHENOUS VEIN W/ RFA    . BREAST BIOPSY     right  . Cervical node     left bx  . COLONOSCOPY  11/05/2010   Procedure: COLONOSCOPY;  Surgeon: Rogene Houston, MD;  [facility];  Service: Endoscopy;  Laterality: N/A;  9:30 am  . COLONOSCOPY N/A 02/22/2017   Procedure: COLONOSCOPY;  Surgeon: Rogene Houston, MD;  Location[facility];  Service: Endoscopy;  Laterality: N/A;  730  . HERNIA REPAIR     right  . POLYPECTOMY  02/22/2017   Procedure: POLYPECTOMY;  Surgeon:  Rogene Houston, MD;  [facility];  Service: Endoscopy;;  colon   . TOTAL KNEE ARTHROPLASTY  11/02/2011   Procedure: TOTAL KNEE ARTHROPLASTY;  Surgeon: Gearlean Alf, MD;  Location: [facility];  Service: Orthopedics;  Laterality: Left;     OB History   None      Home Medications    Prior to Admission medications   Medication Sig Start Date End Date Taking? Authorizing Provider  acetaminophen (TYLENOL) 650 MG CR tablet Take 650 mg daily as needed by mouth for pain.   Yes [provider]  aspirin EC 325 MG tablet Take 325 mg by mouth once as needed for mild pain.   Yes [provider]  Ca Carbonate-Mag Hydroxide (ROLAIDS PO) Take 1 tablet daily as needed by mouth (acid reflux).   Yes [provider]  apixaban (ELIQUIS) 5 MG TABS tablet Take 1 tablet (5 mg total) by mouth 2 (two) times daily. 09/28/17   Isla Pence, MD  metoprolol succinate (TOPROL XL) 25 MG 24 hr tablet Take 1 tablet (25 mg total) by mouth daily. 09/28/17   Isla Pence, MD  Calcium Acetate, Phos Binder, (CALCIUM ACETATE PO) Take by mouth.    06/27/11  [provider]    Family History Family History  Problem Relation Age of Onset  . Colon cancer Mother     Social History Social History   Tobacco Use  . Smoking status: Never Smoker  . Smokeless tobacco: Never Used  Substance Use Topics  . Alcohol use: Yes    Alcohol/week: 1.2 oz    Types: 2 Glasses of wine per week    Comment: Wine- Weekends ( maybe )  . Drug use: No     Allergies   Cardizem [diltiazem hcl]   Review of Systems Review of Systems  Cardiovascular: Positive for palpitations.  All other systems reviewed and are negative.    Physical Exam Updated Vital Signs BP 137/83   Pulse 77   Temp 98.1 F (36.7 C) (Oral)   Resp (!) 25   Ht 5' 10.5" (1.791 m)   Wt 86.2 kg (190 lb)   SpO2 95%   BMI 26.88 kg/m   Physical Exam  Constitutional: She is oriented to person, place, and time.  She appears well-developed and well-nourished.  HENT:  Head: Normocephalic and atraumatic.  Right Ear: External ear normal.  Left Ear: External ear normal.  Nose: Nose normal.  Mouth/Throat: Oropharynx is clear and moist.  Eyes: Pupils are equal, round, and reactive to light. Conjunctivae and EOM are normal.  Neck: Normal range of motion. Neck supple.  Cardiovascular: An irregularly irregular rhythm present. Tachycardia present.  Pulmonary/Chest: Effort normal and breath sounds normal.  Abdominal: Soft. Bowel sounds are normal.  Musculoskeletal: Normal range of motion.  Neurological: She is alert and oriented to person, place, and time.  Skin: Skin is warm. Capillary refill takes less than 2 seconds.  Psychiatric: She has a normal mood and affect. Her behavior is normal. Judgment and thought content normal.  Nursing note and vitals reviewed.    ED Treatments / Results  Labs (all labs ordered are listed, but only abnormal results are displayed) Labs Reviewed  BASIC METABOLIC PANEL - Abnormal; Notable for the following components:      Result Value   Glucose, Bld 145 (*)    All other components within normal limits  MAGNESIUM  CBC  TROPONIN I  TSH    EKG EKG Interpretation  Date/Time:  Thursday September 28 2017 16:37:47 EDT Ventricular Rate:  157 PR Interval:    QRS Duration: 105 QT Interval:  277 QTC Calculation: 448 R Axis:   -41 Text Interpretation:  Atrial fibrillation with rapid V-rate Inferior infarct, old No old tracing to compare Confirmed by Isla Pence [phone]) on 09/28/2017 5:06:45 PM   Radiology Ct Chest Wo Contrast  Result Date: 09/28/2017 CLINICAL DATA:  Tachycardia, right paratracheal soft tissue prominence seen on same day chest radiograph. History of sarcoidosis. EXAM: CT CHEST WITHOUT CONTRAST TECHNIQUE: Multidetector CT imaging of the chest was performed following the standard protocol without IV contrast. COMPARISON:  CXR 09/28/2017 FINDINGS:  Cardiovascular: Conventional branch pattern of the great vessels off the thoracic aorta. Nonaneurysmal minimally atherosclerotic aorta. The unenhanced pulmonary vasculature is unremarkable. Normal size heart without pericardial effusion. Mediastinum/Nodes: The right paratracheal soft tissue prominence seen on chest radiograph is secondary to a substernal goiter. Nonpathologic sized mediastinal lymph nodes. The hila are difficult to evaluate given lack of IV contrast. Patent trachea and mainstem bronchi. The esophagus is unremarkable. Lungs/Pleura: No acute pulmonary consolidation, effusion or pneumothorax. No dominant mass. Apical subpleural scarring is noted bilaterally. Upper Abdomen: No acute abnormality. Musculoskeletal: Mild thoracic spondylosis. No acute osseous abnormality.  IMPRESSION: 1. Right paratracheal soft tissue prominence on same day chest radiograph corresponds with a substernal goiter. No lymphadenopathy. 2. No active pulmonary disease. Aortic Atherosclerosis (ICD10-I70.0). Electronically Signed   By: Ashley Royalty M.D.   On: 09/28/2017 20:51   Dg Chest Port 1 View  Result Date: 09/28/2017 CLINICAL DATA:  tachycardia and "jitterness today. Patient seen at Dr Domenic Polite today and heart rate was 150s. Denies any chest pain or shortness of breath. History of sarcoidosis. EXAM: PORTABLE CHEST 1 VIEW COMPARISON:  10/25/2011 FINDINGS: Heart size is normal. There is prominence of soft tissue in the RIGHT paratracheal region possibly representing adenopathy. Substernal goiter could have a similar appearance. There is no focal consolidation or pleural effusions. IMPRESSION: 1. RIGHT paratracheal density/mass. Although this may represent adenopathy in a patient with sarcoidosis, consider CT of the chest for further evaluation. 2. No pulmonary edema. Electronically Signed   By: Nolon Nations M.D.   On: 09/28/2017 17:09    Procedures Procedures (including critical care time)  Medications Ordered in  ED Medications  sodium chloride 0.9 % bolus 1,000 mL (0 mLs Intravenous Stopped 09/28/17 1757)    And  0.9 %  sodium chloride infusion ( Intravenous New Bag/Given 09/28/17 1938)  diltiazem (CARDIZEM) 1 mg/mL load via infusion 20 mg (20 mg Intravenous Bolus from Bag 09/28/17 1700)    And  diltiazem (CARDIZEM) 100 mg in dextrose 5% 161mL (1 mg/mL) infusion (0 mg/hr Intravenous Stopped 09/28/17 1703)  metoprolol succinate (TOPROL-XL) 24 hr tablet 25 mg (25 mg Oral Given 09/28/17 1957)  diphenhydrAMINE (BENADRYL) injection 25 mg (25 mg Intravenous Given 09/28/17 1707)  metoprolol tartrate (LOPRESSOR) injection 5 mg (5 mg Intravenous Given 09/28/17 1708)  apixaban (ELIQUIS) tablet 5 mg (5 mg Oral Given 09/28/17 1958)  metoprolol tartrate (LOPRESSOR) injection 5 mg (5 mg Intravenous Given 09/28/17 1928)  metoprolol tartrate (LOPRESSOR) injection 5 mg (5 mg Intravenous Given 09/28/17 2037)     Initial Impression / Assessment and Plan / ED Course  I have reviewed the triage vital signs and the nursing notes.  Pertinent labs & imaging results that were available during my care of the patient were reviewed by me and considered in my medical decision making (see chart for details).    Pt given bolus of cardizem and developed red hives up her arm with IV.  Pt given 25 mg benadryl IV which stopped the hives.  Cardizem stopped.    Pt given 5 mg of lopressor IV which slowed her HR to the 70s, but she remained in a.fib.  Pt d/w Dr. Debara Pickett (cards on call) who recommended NO cardioversion as time of onset unclear due to all the palpitations leading up to today.  He did recommend starting pt on Eliquis and having her f/u with Dr. Domenic Polite.  Pt required another dose of lopressor IV and oral toprol.  Pt spontaneously converted and is in NSR at d/c.    CRITICAL CARE Performed by: Isla Pence   Total critical care time: 30  minutes  Critical care time was exclusive of separately billable procedures and  treating other patients.  Critical care was necessary to treat or prevent imminent or life-threatening deterioration.  Critical care was time spent personally by me on the following activities: development of treatment plan with patient and/or surrogate as well as nursing, discussions with consultants, evaluation of patient's response to treatment, examination of patient, obtaining history from patient or surrogate, ordering and performing treatments and interventions, ordering and review of laboratory studies,  ordering and review of radiographic studies, pulse oximetry and re-evaluation of patient's condition.  Final Clinical Impressions(s) / ED Diagnoses   Final diagnoses:  Atrial fibrillation with RVR ([place])  Goiter    ED Discharge Orders        Ordered    apixaban (ELIQUIS) 5 MG TABS tablet  2 times daily     09/28/17 2059    metoprolol succinate (TOPROL XL) 25 MG 24 hr tablet  Daily     09/28/17 2059       Isla Pence, MD 09/28/17 2101

## 2017-09-28 NOTE — ED Notes (Signed)
Hives same but itching gone. Did not swell past elbow. Pt ambulated to bathroom without difficulty on monitor and I stayed with her but states made her a little dizzy. nad

## 2017-09-28 NOTE — ED Notes (Signed)
Pt itching left arm. Pt has hives noted to left arm. cardizem stopped and edp aware.

## 2017-09-29 NOTE — Progress Notes (Deleted)
Cardiology Office Note  Date: 09/29/2017   ID: Special, Ranes [birth year], MRN [id number]  PCP: Glenda Chroman, MD  Primary Cardiologist: Rozann Lesches, MD   No chief complaint on file.   History of Present Illness: Danielle Nicholson is a 72 y.o. female seen recently as a walk-in to the office on June 27 with palpitations that started 20 minutes prior to her arrival.  Her ECG at that time was suggestive of atypical atrial flutter versus atrial tachycardia with RVR.  She was referred to the [facility], ER for further monitoring and ultimately received IV Lopressor with slowing of heart rate and spontaneous conversion to sinus rhythm.  She was diagnosed with atrial fibrillation, although my review of her ECGs still finds that it is more consistent with an atypical atrial flutter versus an ectopic atrial tachycardia, there is organized atrial activity in lead V1 particularly. CHADSVASC score is 2.  She was started on Eliquis at her ER visit and Toprol-XL dose was increased to 25 mg daily.   Past Medical History:  Diagnosis Date  . Diverticula of colon   . Palpitations    PACs and PVCs by Holter  . Sarcoidosis 1973  . Tachycardia   . Thyroid nodule    Dr. Caryl Never at [facility]    Past Surgical History:  Procedure Laterality Date  . ABLATION SAPHENOUS VEIN W/ RFA    . BREAST BIOPSY     right  . Cervical node     left bx  . COLONOSCOPY  11/05/2010   Procedure: COLONOSCOPY;  Surgeon: Rogene Houston, MD;  [facility];  Service: Endoscopy;  Laterality: N/A;  9:30 am  . COLONOSCOPY N/A 02/22/2017   Procedure: COLONOSCOPY;  Surgeon: Rogene Houston, MD;  [facility];  Service: Endoscopy;  Laterality: N/A;  730  . HERNIA REPAIR     right  . POLYPECTOMY  02/22/2017   Procedure: POLYPECTOMY;  Surgeon: Rogene Houston, MD;  [facility];  Service: Endoscopy;;  colon   . TOTAL KNEE ARTHROPLASTY  11/02/2011   Procedure: TOTAL KNEE ARTHROPLASTY;  Surgeon:  Gearlean Alf, MD;  Location: [facility];  Service: Orthopedics;  Laterality: Left;    Current Outpatient Medications  Medication Sig Dispense Refill  . acetaminophen (TYLENOL) 650 MG CR tablet Take 650 mg daily as needed by mouth for pain.    Marland Kitchen apixaban (ELIQUIS) 5 MG TABS tablet Take 1 tablet (5 mg total) by mouth 2 (two) times daily. 60 tablet 0  . aspirin EC 325 MG tablet Take 325 mg by mouth once as needed for mild pain.    . Ca Carbonate-Mag Hydroxide (ROLAIDS PO) Take 1 tablet daily as needed by mouth (acid reflux).    . metoprolol succinate (TOPROL XL) 25 MG 24 hr tablet Take 1 tablet (25 mg total) by mouth daily. 30 tablet 0   No current facility-administered medications for this visit.    Allergies:  Cardizem [diltiazem hcl]   Social History: The patient  reports that she has never smoked. She has never used smokeless tobacco. She reports that she drinks about 1.2 oz of alcohol per week. She reports that she does not use drugs.   Family History: The patient's family history includes Colon cancer in her mother.   ROS:  Please see the history of present illness. Otherwise, complete review of systems is positive for {NONE DEFAULTED:18576::"none"}.  All other systems are reviewed and negative.  Physical Exam: VS:  There were no vitals taken for this visit., BMI There is no height or weight on file to calculate BMI.  Wt Readings from Last 3 Encounters:  09/28/17 190 lb (86.2 kg)  02/22/17 192 lb (87.1 kg)  12/07/16 215 lb (97.5 kg)    General: Patient appears comfortable at rest. HEENT: Conjunctiva and lids normal, oropharynx clear with moist mucosa. Neck: Supple, no elevated JVP or carotid bruits, no thyromegaly. Lungs: Clear to auscultation, nonlabored breathing at rest. Cardiac: Regular rate and rhythm, no S3 or significant systolic murmur, no pericardial rub. Abdomen: Soft, nontender, no hepatomegaly, bowel sounds present, no guarding or rebound. Extremities: No pitting  edema, distal pulses 2+. Skin: Warm and dry. Musculoskeletal: No kyphosis. Neuropsychiatric: Alert and oriented x3, affect grossly appropriate.  ECG: I personally reviewed the tracing from 10/28/2017 which shows probable atypical atrial flutter versus ectopic atrial tachycardia with organized atrial activity noted in lead V1 in particular, variable response with heart rate at 67 bpm.  Recent Labwork: 09/28/2017: BUN 17; Creatinine, Ser 0.83; Hemoglobin 14.6; Magnesium 2.1; Platelets 282; Potassium 3.5; Sodium 139; TSH 3.063   Other Studies Reviewed Today:  Holter monitor 09/14/2017: Representative strips from 24-hour Holter monitor reviewed.  Predominant rhythm is sinus.  Heart rate ranged from 58 bpm up to 125 bpm with average heart rate 78 bpm.  Rare PVCs noted.  Also occasional PACs with bursts of atrial tachycardia that are nonsustained.  No pauses.  Assessment and Plan:   Current medicines were reviewed with the patient today.  No orders of the defined types were placed in this encounter.   Disposition:  Signed, Satira Sark, MD, [facility] 09/29/2017 1:50 PM    [facility] at [facility] [address], [place], [place] [address] Phone: [phone]; Fax: [phone]

## 2017-10-02 ENCOUNTER — Ambulatory Visit (INDEPENDENT_AMBULATORY_CARE_PROVIDER_SITE_OTHER): Payer: Medicare Other | Admitting: Internal Medicine

## 2017-10-02 ENCOUNTER — Encounter: Payer: Self-pay | Admitting: Internal Medicine

## 2017-10-02 ENCOUNTER — Ambulatory Visit (INDEPENDENT_AMBULATORY_CARE_PROVIDER_SITE_OTHER): Payer: Medicare Other | Admitting: Cardiology

## 2017-10-02 ENCOUNTER — Encounter: Payer: Self-pay | Admitting: Cardiology

## 2017-10-02 ENCOUNTER — Ambulatory Visit: Payer: Medicare Other | Admitting: Cardiology

## 2017-10-02 VITALS — BP 132/74 | HR 76 | Ht 70.5 in | Wt 215.0 lb

## 2017-10-02 VITALS — BP 138/78 | HR 82 | Ht 70.5 in | Wt 214.0 lb

## 2017-10-02 DIAGNOSIS — I484 Atypical atrial flutter: Secondary | ICD-10-CM

## 2017-10-02 DIAGNOSIS — I480 Paroxysmal atrial fibrillation: Secondary | ICD-10-CM | POA: Diagnosis not present

## 2017-10-02 DIAGNOSIS — R002 Palpitations: Secondary | ICD-10-CM

## 2017-10-02 DIAGNOSIS — Z862 Personal history of diseases of the blood and blood-forming organs and certain disorders involving the immune mechanism: Secondary | ICD-10-CM

## 2017-10-02 DIAGNOSIS — E041 Nontoxic single thyroid nodule: Secondary | ICD-10-CM | POA: Diagnosis not present

## 2017-10-02 MED ORDER — FLECAINIDE ACETATE 100 MG PO TABS: 200.0000 mg | ORAL_TABLET | Freq: Every day | ORAL | 0 refills | Status: DC | PRN

## 2017-10-02 NOTE — Patient Instructions (Addendum)
Your physician wants you to follow-up in:3 months  With Dr.McDowell     Your physician recommends that you continue on your current medications as directed. Please refer to the Current Medication list given to you today.   If you need a refill on your cardiac medications before your next appointment, please call your pharmacy.    No lab work or tests ordered today.      Thank you for choosing [facility] !

## 2017-10-02 NOTE — Progress Notes (Signed)
Electrophysiology Office Note   Date:  10/02/2017   ID:  Bethan, Adamek 1945-08-11, MRN [id number]  PCP:  Glenda Chroman, MD  Cardiologist:  Dr Domenic Polite Primary Electrophysiologist: Thompson Grayer, MD    CC: afib   History of Present Illness: Danielle Nicholson is a 72 y.o. female who presents today for electrophysiology evaluation.   She is referred by Dr Domenic Polite for EP consultation regarding afib.  She has been followed by Dr Domenic Polite previously for PACs, PVCs and palpitations.  She has not previously had sustained documented arrhythmias.  09/28/17, she presented to Dr McDowell's office with abrupt onset of tachypalpitations.  Initial ekg revealed atrial flutter with RVR.  This appears on subsequently to have degenerated into afib.  She was observed at [facility] ED and initiated on eliquis.  She was discharged and subsequently converted to sinus rhythm.  In retrospect, she had a similar episode about a month prior lasting several hours.  She has done reasonably well since that time.  No further episodes of afib.  Today, she denies symptoms of chest pain, shortness of breath, orthopnea, PND, lower extremity edema, claudication, dizziness, presyncope, syncope, bleeding, or neurologic sequela. The patient is tolerating medications without difficulties and is otherwise without complaint today.    Past Medical History:  Diagnosis Date  . Diverticula of colon   . Palpitations    PACs and PVCs by Holter  . Paroxysmal atrial fibrillation (HCC)   . Sarcoidosis 1973  . Thyroid nodule    [facility]. Caryl Never at [facility]  . Typical atrial flutter [facility])    Past Surgical History:  Procedure Laterality Date  . ABLATION SAPHENOUS VEIN W/ RFA    . BREAST BIOPSY     right  . Cervical node     left bx  . COLONOSCOPY  11/05/2010   Procedure: COLONOSCOPY;  Surgeon: Rogene Houston, MD;  [facility];  Service: Endoscopy;  Laterality: N/A;  9:30 am  . COLONOSCOPY N/A 02/22/2017   Procedure:  COLONOSCOPY;  Surgeon: Rogene Houston, MD;  [facility];  Service: Endoscopy;  Laterality: N/A;  730  . HERNIA REPAIR     right  . POLYPECTOMY  02/22/2017   Procedure: POLYPECTOMY;  Surgeon: Rogene Houston, MD;  [facility];  Service: Endoscopy;;  colon   . TOTAL KNEE ARTHROPLASTY  11/02/2011   Procedure: TOTAL KNEE ARTHROPLASTY;  Surgeon: Gearlean Alf, MD;  [facility];  Service: Orthopedics;  Laterality: Left;     Current Outpatient Medications  Medication Sig Dispense Refill  . acetaminophen (TYLENOL) 650 MG CR tablet Take 650 mg daily as needed by mouth for pain.    Marland Kitchen apixaban (ELIQUIS) 5 MG TABS tablet Take 1 tablet (5 mg total) by mouth 2 (two) times daily. 60 tablet 0  . metoprolol succinate (TOPROL XL) 25 MG 24 hr tablet Take 1 tablet (25 mg total) by mouth daily. 30 tablet 0   No current facility-administered medications for this visit.     Allergies:   Cardizem [diltiazem hcl]   Social History:  The patient  reports that she has never smoked. She has never used smokeless tobacco. She reports that she drinks about 1.2 oz of alcohol per week. She reports that she does not use drugs.   Family History:  The patient's  family history includes Colon cancer in her mother.    ROS:  Please see the history of present illness.  All other systems are personally reviewed and negative.    PHYSICAL EXAM: VS:  BP 132/74   Pulse 76   Ht 5' 10.5" (1.791 m)   Wt 215 lb (97.5 kg)   BMI 30.41 kg/m  , BMI Body mass index is 30.41 kg/m. GEN: Well nourished, well developed, in no acute distress  HEENT: normal  Neck: no JVD, carotid bruits, or masses Cardiac: RRR; no murmurs, rubs, or gallops,no edema  Respiratory:  clear to auscultation bilaterally, normal work of breathing GI: soft, nontender, nondistended, + BS MS: no deformity or atrophy  Skin: warm and dry  Neuro:  Strength and sensation are intact Psych: euthymic mood, full affect  EKG:  EKG  is ordered today. The ekg ordered today is personally reviewed and shows sinus rhythm 76 bpm, PR 164 msec, QRS 78 msec, Qtc 411 msec   Recent Labs: 09/28/2017: BUN 17; Creatinine, Ser 0.83; Hemoglobin 14.6; Magnesium 2.1; Platelets 282; Potassium 3.5; Sodium 139; TSH 3.063  personally reviewed   Lipid Panel  No results found for: CHOL, TRIG, HDL, CHOLHDL, VLDL, LDLCALC, LDLDIRECT personally reviewed   Wt Readings from Last 3 Encounters:  10/02/17 215 lb (97.5 kg)  10/02/17 214 lb (97.1 kg)  09/28/17 190 lb (86.2 kg)      Other studies personally reviewed: Additional studies/ records that were reviewed today include: Dr McDowell's notes, recent ER notes  Review of the above records today demonstrates: as above Echo pending   ASSESSMENT AND PLAN:  1.  Paroxysmal atrial fibrillation and typical atrial flutter Recent diagnosis. chads2vasc score is 2. Now on eliquis for stroke prevention. Continue on toprol. Echo is pending. We discussed AAD therapy today.  She is not yet ready to take daily medicine I will therefore give "pill in pocket' flecainide 200mg  PO x 1 for afib episodes  2. Overweight Body mass index is 30.41 kg/m. Lifestyle modification encouraged   Follow-up:  AF clinic in 2 weeks  Current medicines are reviewed at length with the patient today.   The patient does not have concerns regarding her medicines.  The following changes were made today:  none   Signed, Thompson Grayer, MD  10/02/2017 3:15 PM     [place] [address] [place] [address] [phone] (office) [phone] (fax)

## 2017-10-02 NOTE — Progress Notes (Signed)
Cardiology Office Note  Date: 10/02/2017   ID: Danielle Nicholson, DOB 05-Nov-1945, MRN [id number]  PCP: Glenda Chroman, MD  Primary Cardiologist: Rozann Lesches, MD   Chief Complaint  Patient presents with  . ER follow-up    History of Present Illness: Danielle Nicholson is a 72 y.o. female seen recently as a walk-in to the office on June 27 with palpitations that started 20 minutes prior to her arrival.  Her ECG at that time was suggestive of atypical atrial flutter versus atrial tachycardia with RVR.  She was referred to the [facility], ER for further monitoring and ultimately received IV Lopressor with slowing of heart rate and spontaneous conversion to sinus rhythm.  She was diagnosed with atrial fibrillation, although my review of her ECGs still finds that it is more consistent with an atypical atrial flutter versus an ectopic atrial tachycardia, there is organized atrial activity in lead V1 particularly. CHADSVASC score is 2.  She was started on Eliquis at her ER visit and Toprol-XL dose was increased to 25 mg daily.  He presents today for follow-up, overall feels well this morning.  She is tolerating the recent medication adjustments.  She is also scheduled to see Dr. Rayann Heman today for EP consultation.  Past Medical History:  Diagnosis Date  . Diverticula of colon   . Palpitations    PACs and PVCs by Holter  . Sarcoidosis 1973  . Tachycardia   . Thyroid nodule    Dr. Caryl Never at [facility]    Past Surgical History:  Procedure Laterality Date  . ABLATION SAPHENOUS VEIN W/ RFA    . BREAST BIOPSY     right  . Cervical node     left bx  . COLONOSCOPY  11/05/2010   Procedure: COLONOSCOPY;  Surgeon: Rogene Houston, MD;  [facility];  Service: Endoscopy;  Laterality: N/A;  9:30 am  . COLONOSCOPY N/A 02/22/2017   Procedure: COLONOSCOPY;  Surgeon: Rogene Houston, MD;  [facility];  Service: Endoscopy;  Laterality: N/A;  730  . HERNIA REPAIR     right  .  POLYPECTOMY  02/22/2017   Procedure: POLYPECTOMY;  Surgeon: Rogene Houston, MD;  [facility];  Service: Endoscopy;;  colon   . TOTAL KNEE ARTHROPLASTY  11/02/2011   Procedure: TOTAL KNEE ARTHROPLASTY;  Surgeon: Gearlean Alf, MD;  Location: [facility];  Service: Orthopedics;  Laterality: Left;    Current Outpatient Medications  Medication Sig Dispense Refill  . acetaminophen (TYLENOL) 650 MG CR tablet Take 650 mg daily as needed by mouth for pain.    Marland Kitchen apixaban (ELIQUIS) 5 MG TABS tablet Take 1 tablet (5 mg total) by mouth 2 (two) times daily. 60 tablet 0  . metoprolol succinate (TOPROL XL) 25 MG 24 hr tablet Take 1 tablet (25 mg total) by mouth daily. 30 tablet 0   No current facility-administered medications for this visit.    Allergies:  Cardizem [diltiazem hcl]   Social History: The patient  reports that she has never smoked. She has never used smokeless tobacco. She reports that she drinks about 1.2 oz of alcohol per week. She reports that she does not use drugs.   Family History: The patient's family history includes Colon cancer in her mother.   ROS:  Please see the history of present illness. Otherwise, complete review of systems is positive for none.  All other systems are reviewed and negative.   Physical Exam:  VS:  BP 138/78 (BP Location: Left Arm)   Pulse 82   Ht 5' 10.5" (1.791 m)   Wt 214 lb (97.1 kg)   SpO2 98%   BMI 30.27 kg/m , BMI Body mass index is 30.27 kg/m.  Wt Readings from Last 3 Encounters:  10/02/17 214 lb (97.1 kg)  09/28/17 190 lb (86.2 kg)  02/22/17 192 lb (87.1 kg)    General: Patient appears comfortable at rest. HEENT: Conjunctiva and lids normal, oropharynx clear. Neck: Supple, no elevated JVP or carotid bruits, no thyromegaly. Lungs: Clear to auscultation, nonlabored breathing at rest. Cardiac: Regular rate and rhythm, no S3 or significant systolic murmur. Abdomen: Soft, nontender, bowel sounds present, no guarding or  rebound. Extremities: No pitting edema, distal pulses 2+. Skin: Warm and dry. Musculoskeletal: No kyphosis. Neuropsychiatric: Alert and oriented x3, affect grossly appropriate.  ECG: I personally reviewed the tracing from 10/28/2017 which shows probable atypical atrial flutter versus ectopic atrial tachycardia with organized atrial activity noted in lead V1 in particular, variable response with heart rate at 67 bpm.  Recent Labwork: 09/28/2017: BUN 17; Creatinine, Ser 0.83; Hemoglobin 14.6; Magnesium 2.1; Platelets 282; Potassium 3.5; Sodium 139; TSH 3.063   Other Studies Reviewed Today:  Holter monitor 09/14/2017: Representative strips from 24-hour Holter monitor reviewed.  Predominant rhythm is sinus.  Heart rate ranged from 58 bpm up to 125 bpm with average heart rate 78 bpm.  Rare PVCs noted.  Also occasional PACs with bursts of atrial tachycardia that are nonsustained.  No pauses.  Assessment and Plan:  1.  Recently documented arrhythmia, this looks to be most consistent with atypical atrial flutter or atrial tachycardia with organized atrial activity present in lead III and also V1 on review of all the tracings.  She has a CHADSVASC score of 2, was started on Eliquis at recent ER visit when it was felt this was atrial fibrillation.  For now we will continue, also on Toprol-XL at 25 mg daily which was increased.  She has a visit to see Dr. Rayann Heman for EP consultation today regarding possibility of ablation.  If this is felt by him to be more of a course atrial fibrillation then antiarrhythmic therapy might be pursued first.  [place] will also be obtained.  2.  History of intermittent brief palpitations over the years with previously documented PACs and PVCs.  Above arrhythmia is newly documented.  3.  History of sarcoidosis.  4.  Thyroid nodule.  Recent TSH normal.  Current medicines were reviewed with the patient today.   Orders Placed This Encounter  Procedures  . ECHOCARDIOGRAM  COMPLETE    Disposition: EP consultation with Dr. Rayann Heman, scheduled visit for 3 months thereafter.  Signed, Satira Sark, MD, [facility] 10/02/2017 10:07 AM    [employer] at [place]. [address], [place], [place] [address] Phone: [phone]; Fax: [phone]

## 2017-10-02 NOTE — Patient Instructions (Addendum)
Medication Instructions:  Your physician has recommended you make the following change in your medication:  1. "Pill-in-a-pocket"  --- Flecainide 200 mg -- you may take two tablets daily as needed for atrial fibrillation  Labwork: None ordered  Testing/Procedures: None ordered  Follow-Up: Your physician recommends that you schedule a follow-up appointment in: 2 weeks with Roderic Palau, NP in the AFib clinic.   * If you need a refill on your cardiac medications before your next appointment, please call your pharmacy.   *Please note that any paperwork needing to be filled out by the provider will need to be addressed at the front desk prior to seeing the provider. Please note that any FMLA, disability or other documents regarding health condition is subject to a $25.00 charge that must be received prior to completion of paperwork in the form of a money order or check.  Thank you for choosing [facility]!!    Any Other Special Instructions Will Be Listed Below (If Applicable).   Flecainide tablets What is this medicine? FLECAINIDE (FLEK a nide) is an antiarrhythmic drug. This medicine is used to prevent irregular heart rhythm. It can also slow down fast heartbeats called tachycardia. This medicine may be used for other purposes; ask your health care provider or pharmacist if you have questions. COMMON BRAND NAME(S): Tambocor What should I tell my health care provider before I take this medicine? They need to know if you have any of these conditions: -abnormal levels of potassium in the blood -heart disease including heart rhythm and heart rate problems -kidney or liver disease -recent heart attack -an unusual or allergic reaction to flecainide, local anesthetics, other medicines, foods, dyes, or preservatives -pregnant or trying to get pregnant -breast-feeding How should I use this medicine? Take this medicine by mouth with a glass of water. Follow the directions on the  prescription label. You can take this medicine with or without food. Take your doses at regular intervals. Do not take your medicine more often than directed. Do not stop taking this medicine suddenly. This may cause serious, heart-related side effects. If your doctor wants you to stop the medicine, the dose may be slowly lowered over time to avoid any side effects. Talk to your pediatrician regarding the use of this medicine in children. While this drug may be prescribed for children as young as 1 year of age for selected conditions, precautions do apply. Overdosage: If you think you have taken too much of this medicine contact a poison control center or emergency room at once. NOTE: This medicine is only for you. Do not share this medicine with others. What if I miss a dose? If you miss a dose, take it as soon as you can. If it is almost time for your next dose, take only that dose. Do not take double or extra doses. What may interact with this medicine? Do not take this medicine with any of the following medications: -amoxapine -arsenic trioxide -certain antibiotics like clarithromycin, erythromycin, gatifloxacin, gemifloxacin, levofloxacin, moxifloxacin, sparfloxacin, or troleandomycin -certain antidepressants called tricyclic antidepressants like amitriptyline, imipramine, or nortriptyline -certain medicines to control heart rhythm like disopyramide, dofetilide, encainide, moricizine, procainamide, propafenone, and quinidine -cisapride -cyclobenzaprine -delavirdine -droperidol -haloperidol -hawthorn -imatinib -levomethadyl -maprotiline -medicines for malaria like chloroquine and halofantrine -pentamidine -phenothiazines like chlorpromazine, mesoridazine, prochlorperazine, thioridazine -pimozide -quinine -ranolazine -ritonavir -sertindole -ziprasidone This medicine may also interact with the following medications: -cimetidine -medicines for angina or high blood  pressure -medicines to control heart rhythm like amiodarone and digoxin  This list may not describe all possible interactions. Give your health care provider a list of all the medicines, herbs, non-prescription drugs, or dietary supplements you use. Also tell them if you smoke, drink alcohol, or use illegal drugs. Some items may interact with your medicine. What should I watch for while using this medicine? Visit your doctor or health care professional for regular checks on your progress. Because your condition and the use of this medicine carries some risk, it is a good idea to carry an identification card, necklace or bracelet with details of your condition, medications and doctor or health care professional. Check your blood pressure and pulse rate regularly. Ask your health care professional what your blood pressure and pulse rate should be, and when you should contact him or her. Your doctor or health care professional also may schedule regular blood tests and electrocardiograms to check your progress. You may get drowsy or dizzy. Do not drive, use machinery, or do anything that needs mental alertness until you know how this medicine affects you. Do not stand or sit up quickly, especially if you are an older patient. This reduces the risk of dizzy or fainting spells. Alcohol can make you more dizzy, increase flushing and rapid heartbeats. Avoid alcoholic drinks. What side effects may I notice from receiving this medicine? Side effects that you should report to your doctor or health care professional as soon as possible: -chest pain, continued irregular heartbeats -difficulty breathing -swelling of the legs or feet -trembling, shaking -unusually weak or tired Side effects that usually do not require medical attention (report to your doctor or health care professional if they continue or are bothersome): -blurred vision -constipation -headache -nausea, vomiting -stomach pain This list may not  describe all possible side effects. Call your doctor for medical advice about side effects. You may report side effects to FDA at 1-800-FDA-1088. Where should I keep my medicine? Keep out of the reach of children. Store at room temperature between 15 and 30 degrees C (59 and 86 degrees F). Protect from light. Keep container tightly closed. Throw away any unused medicine after the expiration date. NOTE: This sheet is a summary. It may not cover all possible information. If you have questions about this medicine, talk to your doctor, pharmacist, or health care provider.  2018 Elsevier/Gold Standard (2007-07-25 16:46:09)

## 2017-10-04 ENCOUNTER — Ambulatory Visit (HOSPITAL_COMMUNITY)
Admission: RE | Admit: 2017-10-04 | Discharge: 2017-10-04 | Disposition: A | Discharge status: Home / Self Care | Payer: Medicare Other | Source: Ambulatory Visit | Attending: Cardiology | Admitting: Cardiology

## 2017-10-04 ENCOUNTER — Telehealth: Payer: Self-pay | Admitting: *Deleted

## 2017-10-04 DIAGNOSIS — R000 Tachycardia, unspecified: Secondary | ICD-10-CM | POA: Insufficient documentation

## 2017-10-04 DIAGNOSIS — R002 Palpitations: Secondary | ICD-10-CM | POA: Diagnosis not present

## 2017-10-04 DIAGNOSIS — D869 Sarcoidosis, unspecified: Secondary | ICD-10-CM | POA: Insufficient documentation

## 2017-10-04 NOTE — Telephone Encounter (Signed)
-----   Message from Satira Sark, MD sent at 10/04/2017 10:37 AM EDT ----- Results reviewed.  Study shows normal LVEF 60 to 65%, no major valvular abnormalities.  Left atrium normal size.  Continue with current plan. A copy of this test should be forwarded to Glenda Chroman, MD.

## 2017-10-04 NOTE — Telephone Encounter (Signed)
Patient informed and copy sent to PCP. 

## 2017-10-04 NOTE — Progress Notes (Signed)
*  PRELIMINARY RESULTS* Echocardiogram 2D Echocardiogram has been performed.  Leavy Cella 10/04/2017, 9:30 AM

## 2017-10-10 DIAGNOSIS — Z7189 Other specified counseling: Secondary | ICD-10-CM | POA: Diagnosis not present

## 2017-10-10 DIAGNOSIS — Z1339 Encounter for screening examination for other mental health and behavioral disorders: Secondary | ICD-10-CM | POA: Diagnosis not present

## 2017-10-10 DIAGNOSIS — Z789 Other specified health status: Secondary | ICD-10-CM | POA: Diagnosis not present

## 2017-10-10 DIAGNOSIS — E7800 Pure hypercholesterolemia, unspecified: Secondary | ICD-10-CM | POA: Diagnosis not present

## 2017-10-10 DIAGNOSIS — Z1331 Encounter for screening for depression: Secondary | ICD-10-CM | POA: Diagnosis not present

## 2017-10-10 DIAGNOSIS — I4891 Unspecified atrial fibrillation: Secondary | ICD-10-CM | POA: Diagnosis not present

## 2017-10-10 DIAGNOSIS — Z299 Encounter for prophylactic measures, unspecified: Secondary | ICD-10-CM | POA: Diagnosis not present

## 2017-10-10 DIAGNOSIS — Z6830 Body mass index (BMI) 30.0-30.9, adult: Secondary | ICD-10-CM | POA: Diagnosis not present

## 2017-10-10 DIAGNOSIS — Z0000 Encounter for general adult medical examination without abnormal findings: Secondary | ICD-10-CM | POA: Diagnosis not present

## 2017-10-10 DIAGNOSIS — Z1211 Encounter for screening for malignant neoplasm of colon: Secondary | ICD-10-CM | POA: Diagnosis not present

## 2017-10-16 ENCOUNTER — Encounter (HOSPITAL_COMMUNITY): Payer: Self-pay | Admitting: Nurse Practitioner

## 2017-10-16 ENCOUNTER — Ambulatory Visit (HOSPITAL_COMMUNITY)
Admission: RE | Admit: 2017-10-16 | Discharge: 2017-10-16 | Disposition: A | Discharge status: Home / Self Care | Payer: Medicare Other | Source: Ambulatory Visit | Attending: Nurse Practitioner | Admitting: Nurse Practitioner

## 2017-10-16 VITALS — BP 124/68 | HR 75 | Ht 70.5 in | Wt 216.0 lb

## 2017-10-16 DIAGNOSIS — Z9889 Other specified postprocedural states: Secondary | ICD-10-CM | POA: Insufficient documentation

## 2017-10-16 DIAGNOSIS — Z79899 Other long term (current) drug therapy: Secondary | ICD-10-CM | POA: Diagnosis not present

## 2017-10-16 DIAGNOSIS — I483 Typical atrial flutter: Secondary | ICD-10-CM | POA: Insufficient documentation

## 2017-10-16 DIAGNOSIS — Z96652 Presence of left artificial knee joint: Secondary | ICD-10-CM | POA: Diagnosis not present

## 2017-10-16 DIAGNOSIS — Z6830 Body mass index (BMI) 30.0-30.9, adult: Secondary | ICD-10-CM | POA: Diagnosis not present

## 2017-10-16 DIAGNOSIS — I480 Paroxysmal atrial fibrillation: Secondary | ICD-10-CM | POA: Insufficient documentation

## 2017-10-16 DIAGNOSIS — E663 Overweight: Secondary | ICD-10-CM | POA: Diagnosis not present

## 2017-10-16 NOTE — Progress Notes (Signed)
Primary Care Physician: Glenda Chroman, MD Primary Cardiologist: Domenic Polite Primary Electrophysiologist: Allred Referring Physician: Ponciano Ort is a 72 y.o. female with a history of paroxysmal atrial fibrillation and typical atrial flutter who presents for consultation in the [facility].  The patient was initially diagnosed with atrial fibrillation 09/2017 after presenting with symptoms of palpitations.  Pill in the pocket Flecainide was given at last office visit with Dr Rayann Heman.   Today, she denies symptoms of palpitations, chest pain, shortness of breath, orthopnea, PND, lower extremity edema, dizziness, presyncope, syncope, snoring, daytime somnolence, bleeding, or neurologic sequela. The patient is tolerating medications without difficulties and is otherwise without complaint today.    Atrial Fibrillation Risk Factors:  she does not have symptoms or diagnosis of sleep apnea.  she does not have a history of rheumatic fever.  she does not have a history of alcohol use.  she has a BMI of Body mass index is 30.55 kg/m.Marland Kitchen Filed Weights   10/16/17 1355  Weight: 216 lb (98 kg)    LA size: 34   Atrial Fibrillation Management history:  Previous antiarrhythmic drugs: Flecainide pill in the pocket started 09/2017  Previous cardioversions: none  Previous ablations: none  CHADS2VASC score: 2  Anticoagulation history: Eliquis   Past Medical History:  Diagnosis Date  . Diverticula of colon   . Palpitations    PACs and PVCs by Holter  . Paroxysmal atrial fibrillation (HCC)   . Sarcoidosis 1973  . Thyroid nodule    Dr. Caryl Never at [facility]  . Typical atrial flutter [facility])    Past Surgical History:  Procedure Laterality Date  . ABLATION SAPHENOUS VEIN W/ RFA    . BREAST BIOPSY     right  . Cervical node     left bx  . COLONOSCOPY  11/05/2010   Procedure: COLONOSCOPY;  Surgeon: Rogene Houston, MD;  [facility];  Service:  Endoscopy;  Laterality: N/A;  9:30 am  . COLONOSCOPY N/A 02/22/2017   Procedure: COLONOSCOPY;  Surgeon: Rogene Houston, MD;  [facility];  Service: Endoscopy;  Laterality: N/A;  730  . HERNIA REPAIR     right  . POLYPECTOMY  02/22/2017   Procedure: POLYPECTOMY;  Surgeon: Rogene Houston, MD;  [facility];  Service: Endoscopy;;  colon   . TOTAL KNEE ARTHROPLASTY  11/02/2011   Procedure: TOTAL KNEE ARTHROPLASTY;  Surgeon: Gearlean Alf, MD;  Location[facility];  Service: Orthopedics;  Laterality: Left;    Current Outpatient Medications  Medication Sig Dispense Refill  . acetaminophen (TYLENOL) 650 MG CR tablet Take 650 mg daily as needed by mouth for pain.    Marland Kitchen apixaban (ELIQUIS) 5 MG TABS tablet Take 1 tablet (5 mg total) by mouth 2 (two) times daily. 60 tablet 0  . metoprolol succinate (TOPROL XL) 25 MG 24 hr tablet Take 1 tablet (25 mg total) by mouth daily. 30 tablet 0  . flecainide (TAMBOCOR) 100 MG tablet Take 2 tablets (200 mg total) by mouth daily as needed. For atrial fibrillation (Patient not taking: Reported on 10/16/2017) 10 tablet 0   No current facility-administered medications for this encounter.     Allergies  Allergen Reactions  . Cardizem [Diltiazem Hcl] Hives and Itching    Social History   Socioeconomic History  . Marital status: Married    Spouse name: Not on file  . Number of children: Not on file  .  Years of education: Not on file  . Highest education level: Not on file  Occupational History  . Not on file  Social Needs  . Financial resource strain: Not on file  . Food insecurity:    Worry: Not on file    Inability: Not on file  . Transportation needs:    Medical: Not on file    Non-medical: Not on file  Tobacco Use  . Smoking status: Never Smoker  . Smokeless tobacco: Never Used  Substance and Sexual Activity  . Alcohol use: Yes    Alcohol/week: 1.2 oz    Types: 2 Glasses of wine per week    Comment: Wine- Weekends (  maybe )  . Drug use: No  . Sexual activity: Not on file  Lifestyle  . Physical activity:    Days per week: Not on file    Minutes per session: Not on file  . Stress: Not on file  Relationships  . Social connections:    Talks on phone: Not on file    Gets together: Not on file    Attends religious service: Not on file    Active member of club or organization: Not on file    Attends meetings of clubs or organizations: Not on file    Relationship status: Not on file  . Intimate partner violence:    Fear of current or ex partner: Not on file    Emotionally abused: Not on file    Physically abused: Not on file    Forced sexual activity: Not on file  Other Topics Concern  . 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 Colon cancer Mother    The patient does not have a history of early familial atrial fibrillation or other arrhythmias.  ROS- All systems are reviewed and negative except as per the HPI above.  Physical Exam: Vitals:   10/16/17 1355  BP: 124/68  Pulse: 75  Weight: 216 lb (98 kg)  Height: 5' 10.5" (1.791 m)    GEN- The patient is well appearing, alert and oriented x 3 today.   Head- normocephalic, atraumatic Eyes-  Sclera clear, conjunctiva pink Ears- hearing intact Oropharynx- clear Neck- supple  Lungs- Clear to ausculation bilaterally, normal work of breathing Heart- Regular rate and rhythm  GI- soft, NT, ND, + BS Extremities- no clubbing, cyanosis, or edema MS- no significant deformity or atrophy Skin- no rash or lesion Psych- euthymic mood, full affect Neuro- strength and sensation are intact  Wt Readings from Last 3 Encounters:  10/16/17 216 lb (98 kg)  10/02/17 215 lb (97.5 kg)  10/02/17 214 lb (97.1 kg)    EKG today demonstrates sinus rhythm, rate 76, PR 128msec, QRS 63msec, QTc 411msec Echo 10/2017 demonstrated EF 60-65%, mild LVH, no  RWMA  Epic records are reviewed at length today  Assessment and Plan:  1. Paroxysmal atrial fibrillation/typical atrial flutter The patient has symptomatic paroxysmal atrial fibrillation.   Continue Eliquis for CHADS2VASC of 2 Flecainide pill in the pocket given at last office visit with Dr Rayann Heman. She has not had to take. We reviewed that if she has AF episodes lasting longer than 3-4 hours she should take Flecainide. Not to take more than 2-3 times per week. If she finds her episodes are increasing in frequency can consider maintenance Flecainide Lifestyle modification reviewed today  2. Overweight  Body mass index is 30.55 kg/m. Weight loss advised She has arthritis in her feet - recommended water aerobics at the [facility]  Follow up with Dr Domenic Polite as scheduled. She will call the AF clinic for issues/recurrent AF.   Chanetta Marshall, NP 10/16/2017 2:09 PM

## 2017-10-27 ENCOUNTER — Other Ambulatory Visit: Payer: Self-pay | Admitting: Cardiology

## 2017-10-27 MED ORDER — APIXABAN 5 MG PO TABS: 5.0000 mg | ORAL_TABLET | Freq: Two times a day (BID) | ORAL | 2 refills | Status: DC

## 2017-10-27 NOTE — Telephone Encounter (Signed)
°*  STAT* If patient is at the pharmacy, call can be transferred to refill team.   1. Which medications need to be refilled?  apixaban (ELIQUIS) 5 MG TABS tablet    2. Which pharmacy/location (including street and city if local pharmacy) is medication to be sent to? [facility]  3. Do they need a 30 day or 90 day supply?

## 2017-11-06 ENCOUNTER — Telehealth (HOSPITAL_COMMUNITY): Payer: Self-pay | Admitting: *Deleted

## 2017-11-06 NOTE — Telephone Encounter (Signed)
Patient called in stating she went into afib over the weekend. Took extra metoprolol this did not convert her so used flecainide PIP took most of the night but she woke up in NSR this morning. Pt to call if breakthrough afib increases in frequency.

## 2017-11-23 DIAGNOSIS — Z961 Presence of intraocular lens: Secondary | ICD-10-CM | POA: Diagnosis not present

## 2017-11-23 DIAGNOSIS — H40013 Open angle with borderline findings, low risk, bilateral: Secondary | ICD-10-CM | POA: Diagnosis not present

## 2017-11-23 DIAGNOSIS — H2511 Age-related nuclear cataract, right eye: Secondary | ICD-10-CM | POA: Diagnosis not present

## 2017-11-23 DIAGNOSIS — H25011 Cortical age-related cataract, right eye: Secondary | ICD-10-CM | POA: Diagnosis not present

## 2017-12-05 ENCOUNTER — Other Ambulatory Visit: Payer: Self-pay | Admitting: *Deleted

## 2017-12-05 MED ORDER — METOPROLOL SUCCINATE ER 25 MG PO TB24: 25.0000 mg | ORAL_TABLET | Freq: Every day | ORAL | 2 refills | Status: DC

## 2017-12-11 ENCOUNTER — Telehealth: Payer: Self-pay | Admitting: Cardiology

## 2017-12-11 NOTE — Telephone Encounter (Signed)
Opened in Error.

## 2017-12-18 ENCOUNTER — Telehealth (HOSPITAL_COMMUNITY): Payer: Self-pay | Admitting: *Deleted

## 2017-12-18 NOTE — Telephone Encounter (Signed)
Patient stated she had more af over the weekend was hesitant to take flecainide PIP so she only took metoprolol extra. It did finally convert her to normal rhythm after many hours. She does establish with Dr. Domenic Polite next month and will bring in a log of her episodes of AF.

## 2017-12-28 DIAGNOSIS — Z23 Encounter for immunization: Secondary | ICD-10-CM | POA: Diagnosis not present

## 2018-01-01 NOTE — Progress Notes (Signed)
Cardiology Office Note  Date: 01/02/2018   ID: Danielle Nicholson, DOB 17-Mar-1946, MRN [id number]  PCP: Glenda Chroman, MD  Primary Cardiologist: Rozann Lesches, MD   Chief Complaint  Patient presents with  . Atrial Fibrillation    History of Present Illness: Danielle Nicholson is a 72 y.o. female last seen in July.  He has had subsequent follow-up with Dr. Rayann Heman and also in the atrial fibrillation clinic.  She was placed on pill in the pocket flecainide.  She presents today for follow-up, states that she has had 2 episodes of breakthrough atrial fibrillation.  She had a prolonged episode lasting nearly 19 hours, did take flecainide with that dose, and it sounds like she converted to atrial flutter initially before converting back to sinus rhythm.  She recalls having a very rapid, regular heart rate.  She did not take any extra metoprolol at that time.  Otherwise she reports compliance with Eliquis, no focal neurological symptoms, no bleeding problems.  We went over her medications and discussed adjustments.  She does a good job tracking her heart rate and blood pressure at home.  Generally her resting heart rate is in the 70s on metoprolol 25 mg daily.  Past Medical History:  Diagnosis Date  . Diverticula of colon   . Paroxysmal atrial fibrillation (HCC)   . Sarcoidosis 1973  . Thyroid nodule    Dr. Caryl Never at [facility]  . Typical atrial flutter [facility])     Past Surgical History:  Procedure Laterality Date  . ABLATION SAPHENOUS VEIN W/ RFA    . BREAST BIOPSY     right  . Cervical node     left bx  . COLONOSCOPY  11/05/2010   Procedure: COLONOSCOPY;  Surgeon: Rogene Houston, MD;  [facility];  Service: Endoscopy;  Laterality: N/A;  9:30 am  . COLONOSCOPY N/A 02/22/2017   Procedure: COLONOSCOPY;  Surgeon: Rogene Houston, MD;  [facility];  Service: Endoscopy;  Laterality: N/A;  730  . HERNIA REPAIR     right  . POLYPECTOMY  02/22/2017   Procedure: POLYPECTOMY;   Surgeon: Rogene Houston, MD;  [facility];  Service: Endoscopy;;  colon   . TOTAL KNEE ARTHROPLASTY  11/02/2011   Procedure: TOTAL KNEE ARTHROPLASTY;  Surgeon: Gearlean Alf, MD;  [facility];  Service: Orthopedics;  Laterality: Left;    Current Outpatient Medications  Medication Sig Dispense Refill  . acetaminophen (TYLENOL) 650 MG CR tablet Take 650 mg daily as needed by mouth for pain.    Marland Kitchen apixaban (ELIQUIS) 5 MG TABS tablet Take 1 tablet (5 mg total) by mouth 2 (two) times daily. 60 tablet 2  . flecainide (TAMBOCOR) 100 MG tablet Take 2 tablets (200 mg total) by mouth daily as needed. For atrial fibrillation 10 tablet 0  . metoprolol succinate (TOPROL XL) 25 MG 24 hr tablet Take 1 tablet (25 mg total) by mouth daily. 90 tablet 2   No current facility-administered medications for this visit.    Allergies:  Cardizem [diltiazem hcl]   Social History: The patient  reports that she has never smoked. She has never used smokeless tobacco. She reports that she drinks about 2.0 standard drinks of alcohol per week. She reports that she does not use drugs.   ROS:  Please see the history of present illness. Otherwise, complete review of systems is positive for none.  All other systems are reviewed and negative.  Physical Exam: VS:  BP 122/72   Pulse 73   Ht 5' 10.5" (1.791 m)   Wt 214 lb (97.1 kg)   SpO2 98%   BMI 30.27 kg/m , BMI Body mass index is 30.27 kg/m.  Wt Readings from Last 3 Encounters:  01/02/18 214 lb (97.1 kg)  10/16/17 216 lb (98 kg)  10/02/17 215 lb (97.5 kg)    General: Patient appears comfortable at rest. HEENT: Conjunctiva and lids normal, oropharynx clear. Neck: Supple, no elevated JVP or carotid bruits, no thyromegaly. Lungs: Clear to auscultation, nonlabored breathing at rest. Cardiac: Regular rate and rhythm, no S3 or significant systolic murmur. Abdomen: Soft, nontender, bowel sounds present. Extremities: No pitting edema, distal pulses  2+. Skin: Warm and dry. Musculoskeletal: No kyphosis. Neuropsychiatric: Alert and oriented x3, affect grossly appropriate.  ECG: I personally reviewed the tracing from 10/16/2017 which showed sinus rhythm with inferior Q waves and poor R wave progression with low voltage.  Recent Labwork: 09/28/2017: BUN 17; Creatinine, Ser 0.83; Hemoglobin 14.6; Magnesium 2.1; Platelets 282; Potassium 3.5; Sodium 139; TSH 3.063   Other Studies Reviewed Today:  Echocardiogram 10/04/2017: Study Conclusions  - Left ventricle: The cavity size was normal. Wall thickness was   increased in a pattern of mild LVH. Systolic function was normal.   The estimated ejection fraction was in the range of 60% to 65%.   Wall motion was normal; there were no regional wall motion   abnormalities. Left ventricular diastolic function parameters   were normal.  Assessment and Plan:  1.  Paroxysmal atrial fibrillation with CHADSVASC score of 2.  Plan is to continue Eliquis for stroke prophylaxis.  We will increase Toprol-XL to 50 mg daily.  Continue to use flecainide on an as needed basis, she takes 200 mg at once for conversion.  I also suggested that she take an extra Toprol-XL 25 mg tablet at the time that she takes her flecainide.  Depending on frequency of breakthrough events, we could always consider starting her on standing dose of flecainide.  2.  History of sarcoidosis.  3.  History of thyroid nodule with normal TSH, follows with PCP.  Current medicines were reviewed with the patient today.  Disposition: Follow-up in 3 months.  Signed, Satira Sark, MD, [facility] 01/02/2018 9:49 AM    [facility] [place], [place], [place] [address] Phone: [phone]; Fax: [phone]

## 2018-01-02 ENCOUNTER — Encounter: Payer: Self-pay | Admitting: Cardiology

## 2018-01-02 ENCOUNTER — Ambulatory Visit (INDEPENDENT_AMBULATORY_CARE_PROVIDER_SITE_OTHER): Payer: Medicare Other | Admitting: Cardiology

## 2018-01-02 VITALS — BP 122/72 | HR 73 | Ht 70.5 in | Wt 214.0 lb

## 2018-01-02 DIAGNOSIS — E041 Nontoxic single thyroid nodule: Secondary | ICD-10-CM | POA: Diagnosis not present

## 2018-01-02 DIAGNOSIS — Z862 Personal history of diseases of the blood and blood-forming organs and certain disorders involving the immune mechanism: Secondary | ICD-10-CM

## 2018-01-02 DIAGNOSIS — I480 Paroxysmal atrial fibrillation: Secondary | ICD-10-CM | POA: Diagnosis not present

## 2018-01-02 MED ORDER — METOPROLOL SUCCINATE ER 25 MG PO TB24: 50.0000 mg | ORAL_TABLET | Freq: Every evening | ORAL | 2 refills | Status: DC

## 2018-01-02 NOTE — Patient Instructions (Addendum)
Medication Instructions:   Your physician has recommended you make the following change in your medication:   Increase toprol xl to 50 mg daily in the evening. Please take (2) of your 25 mg tablets.  You may take an extra 25 mg toprol xl as needed.  Continue all other medications the same.  Labwork:  NONE  Testing/Procedures:  NONE  Follow-Up:  Your physician recommends that you schedule a follow-up appointment in: 3 months  Any Other Special Instructions Will Be Listed Below (If Applicable).  If you need a refill on your cardiac medications before your next appointment, please call your pharmacy.

## 2018-01-17 ENCOUNTER — Other Ambulatory Visit: Payer: Self-pay | Admitting: Cardiology

## 2018-02-14 DIAGNOSIS — I8390 Asymptomatic varicose veins of unspecified lower extremity: Secondary | ICD-10-CM | POA: Diagnosis not present

## 2018-02-14 DIAGNOSIS — I480 Paroxysmal atrial fibrillation: Secondary | ICD-10-CM | POA: Diagnosis not present

## 2018-02-14 DIAGNOSIS — Z299 Encounter for prophylactic measures, unspecified: Secondary | ICD-10-CM | POA: Diagnosis not present

## 2018-02-14 DIAGNOSIS — M25512 Pain in left shoulder: Secondary | ICD-10-CM | POA: Diagnosis not present

## 2018-02-14 DIAGNOSIS — Z6837 Body mass index (BMI) 37.0-37.9, adult: Secondary | ICD-10-CM | POA: Diagnosis not present

## 2018-03-22 ENCOUNTER — Telehealth (HOSPITAL_COMMUNITY): Payer: Self-pay | Admitting: *Deleted

## 2018-03-22 NOTE — Telephone Encounter (Signed)
Patient is having increasing afib both duration and frequency. Patient will try increasing metoprolol to 25mg  in the Am and 50mg  in the PM over weekend and call on Monday with report of how she is feeling.

## 2018-03-26 ENCOUNTER — Other Ambulatory Visit (HOSPITAL_COMMUNITY): Payer: Self-pay | Admitting: *Deleted

## 2018-03-26 MED ORDER — METOPROLOL SUCCINATE ER 25 MG PO TB24: ORAL_TABLET | ORAL | 6 refills | Status: DC

## 2018-03-26 NOTE — Telephone Encounter (Signed)
Pt called in stating she has had no afib episodes since increasing metoprolol. Will continue at current dose.

## 2018-03-26 NOTE — Addendum Note (Signed)
Addended by: Juluis Mire on: 03/26/2018 02:08 PM   Modules accepted: Orders

## 2018-04-09 NOTE — Progress Notes (Signed)
Cardiology Office Note  Date: 04/10/2018   ID: Clessie, Karras 15-Dec-1945, MRN [id number]  PCP: Glenda Chroman, MD  Primary Cardiologist: Rozann Lesches, MD   Chief Complaint  Patient presents with  . Atrial Fibrillation    History of Present Illness: Danielle Nicholson is a 73 y.o. female last seen in October 2019.  She presents for a routine visit.  She has had more frequent episodes of atrial fibrillation since our last encounter.  In December alone she had 8 separate events although generally these were fairly brief lasting no more than about 20 minutes.  She has not used flecainide with any of these events.  Beta-blocker was uptitrated in the interim.  So far in January she has had only one event.  I reviewed her medications which are outlined below.  She reports no bleeding problems on Eliquis.  I did talk with her today about considering going on standing low-dose flecainide for better rhythm suppression, particularly if her breakthrough episodes remain as frequent as they have been in December.  She wants to see how things go over the next month.  Past Medical History:  Diagnosis Date  . Diverticula of colon   . Paroxysmal atrial fibrillation (HCC)   . Sarcoidosis 1973  . Thyroid nodule    Dr. Caryl Never at [facility]  . Typical atrial flutter [facility])     Past Surgical History:  Procedure Laterality Date  . ABLATION SAPHENOUS VEIN W/ RFA    . BREAST BIOPSY     right  . Cervical node     left bx  . COLONOSCOPY  11/05/2010   Procedure: COLONOSCOPY;  Surgeon: Rogene Houston, MD;  [facility];  Service: Endoscopy;  Laterality: N/A;  9:30 am  . COLONOSCOPY N/A 02/22/2017   Procedure: COLONOSCOPY;  Surgeon: Rogene Houston, MD;  [facility];  Service: Endoscopy;  Laterality: N/A;  730  . HERNIA REPAIR     right  . POLYPECTOMY  02/22/2017   Procedure: POLYPECTOMY;  Surgeon: Rogene Houston, MD;  [facility];  Service: Endoscopy;;  colon   .  TOTAL KNEE ARTHROPLASTY  11/02/2011   Procedure: TOTAL KNEE ARTHROPLASTY;  Surgeon: Gearlean Alf, MD;  [facility];  Service: Orthopedics;  Laterality: Left;    Current Outpatient Medications  Medication Sig Dispense Refill  . acetaminophen (TYLENOL) 650 MG CR tablet Take 650 mg daily as needed by mouth for pain.    Marland Kitchen ELIQUIS 5 MG TABS tablet TAKE 1 TABLET BY MOUTH TWICE DAILY 60 tablet 3  . flecainide (TAMBOCOR) 100 MG tablet Take 2 tablets (200 mg total) by mouth daily as needed. For atrial fibrillation 10 tablet 0  . metoprolol succinate (TOPROL XL) 25 MG 24 hr tablet Take 1 tablet in the AM and 2 tablets in the PM 90 tablet 6   No current facility-administered medications for this visit.    Allergies:  Cardizem [diltiazem hcl]   Social History: The patient  reports that she has never smoked. She has never used smokeless tobacco. She reports current alcohol use of about 2.0 standard drinks of alcohol per week. She reports that she does not use drugs.   ROS:  Please see the history of present illness. Otherwise, complete review of systems is positive for none.  All other systems are reviewed and negative.   Physical Exam: VS:  BP 124/82   Pulse 74   Ht 5\' 10"  (1.778  m)   Wt 219 lb (99.3 kg)   SpO2 97%   BMI 31.42 kg/m , BMI Body mass index is 31.42 kg/m.  Wt Readings from Last 3 Encounters:  04/10/18 219 lb (99.3 kg)  01/02/18 214 lb (97.1 kg)  10/16/17 216 lb (98 kg)    General: Patient appears comfortable at rest. HEENT: Conjunctiva and lids normal, oropharynx clear. Neck: Supple, no elevated JVP or carotid bruits, no thyromegaly. Lungs: Clear to auscultation, nonlabored breathing at rest. Cardiac: Regular rate and rhythm, no S3 or significant systolic murmur. Abdomen: Soft, nontender, bowel sounds present. Extremities: No pitting edema, distal pulses 2+. Skin: Warm and dry. Musculoskeletal: No kyphosis. Neuropsychiatric: Alert and oriented x3, affect grossly  appropriate.  ECG: I personally reviewed the tracing from July 2019 which showed sinus rhythm with inferior Q waves and poor R wave progression, low voltage.  Recent Labwork: 09/28/2017: BUN 17; Creatinine, Ser 0.83; Hemoglobin 14.6; Magnesium 2.1; Platelets 282; Potassium 3.5; Sodium 139; TSH 3.063   Other Studies Reviewed Today:  Echocardiogram 10/04/2017: Study Conclusions  - Left ventricle: The cavity size was normal. Wall thickness was increased in a pattern of mild LVH. Systolic function was normal. The estimated ejection fraction was in the range of 60% to 65%. Wall motion was normal; there were no regional wall motion abnormalities. Left ventricular diastolic function parameters were normal.  Assessment and Plan:  1.  Paroxysmal atrial fibrillation with CHADSVASC score of 2.  At this point she plans to continue with present regimen, although we did discuss considering use of standing low-dose flecainide for better rhythm suppression.  She will see how things go over the next month.  2.  History of sarcoidosis, quiescent.  Current medicines were reviewed with the patient today.  Disposition: Follow-up in 3 months.  Signed, Satira Sark, MD, [facility] 04/10/2018 10:20 [facility] [place], [place], [place] [address] Phone: [phone]; Fax: [phone]

## 2018-04-10 ENCOUNTER — Ambulatory Visit (INDEPENDENT_AMBULATORY_CARE_PROVIDER_SITE_OTHER): Payer: Medicare Other | Admitting: Cardiology

## 2018-04-10 ENCOUNTER — Encounter: Payer: Self-pay | Admitting: Cardiology

## 2018-04-10 VITALS — BP 124/82 | HR 74 | Ht 70.0 in | Wt 219.0 lb

## 2018-04-10 DIAGNOSIS — Z862 Personal history of diseases of the blood and blood-forming organs and certain disorders involving the immune mechanism: Secondary | ICD-10-CM

## 2018-04-10 DIAGNOSIS — I480 Paroxysmal atrial fibrillation: Secondary | ICD-10-CM

## 2018-04-10 NOTE — Patient Instructions (Addendum)
Medication Instructions:   Your physician recommends that you continue on your current medications as directed. Please refer to the Current Medication list given to you today.  Labwork:  NONE  Testing/Procedures:  NONE  Follow-Up:  Your physician recommends that you schedule a follow-up appointment in: 3 months.  Any Other Special Instructions Will Be Listed Below (If Applicable).  Call office if you continue to have symptoms of your a-fib.  If you need a refill on your cardiac medications before your next appointment, please call your pharmacy.

## 2018-05-14 DIAGNOSIS — H2511 Age-related nuclear cataract, right eye: Secondary | ICD-10-CM | POA: Diagnosis not present

## 2018-05-14 DIAGNOSIS — H40013 Open angle with borderline findings, low risk, bilateral: Secondary | ICD-10-CM | POA: Diagnosis not present

## 2018-05-14 DIAGNOSIS — H25011 Cortical age-related cataract, right eye: Secondary | ICD-10-CM | POA: Diagnosis not present

## 2018-05-16 ENCOUNTER — Other Ambulatory Visit: Payer: Self-pay | Admitting: Cardiology

## 2018-07-18 ENCOUNTER — Telehealth: Payer: Self-pay | Admitting: *Deleted

## 2018-07-18 NOTE — Telephone Encounter (Signed)
Pt verbalized consent for 07/24/18 telehealth appt with Dr Domenic Polite. Reviewed medications/allergies/pharmacy. Pt doesn't have scale/BP monitor but will have HR available.

## 2018-07-23 NOTE — Progress Notes (Signed)
Virtual Visit via Telephone Note   This visit type was conducted due to national recommendations for restrictions regarding the COVID-19 Pandemic (e.g. social distancing) in an effort to limit this patient's exposure and mitigate transmission in our community.  Due to her co-morbid illnesses, this patient is at least at moderate risk for complications without adequate follow up.  This format is felt to be most appropriate for this patient at this time.  The patient did not have access to video technology/had technical difficulties with video requiring transitioning to audio format only (telephone).  All issues noted in this document were discussed and addressed.  No physical exam could be performed with this format.  Please refer to the patient's chart for her  consent to telehealth for [facility].   Evaluation Performed:  Follow-up visit  Date:  07/24/2018   ID:  Danielle, Nicholson Dec 23, 1945, MRN [id number]  Patient Location: Home Provider Location: Office  PCP:  Glenda Chroman, MD  Cardiologist:  Rozann Lesches, MD  Chief Complaint:  Atrial fibrillation  History of Present Illness:    Danielle Nicholson is a 73 y.o. female last seen in January.  She did not have video access today and we spoke by phone.  Since last assessment she reports 2 separate episodes of atrial fibrillation, the first lasting about 5 hours in January, and the second lasting about 11 hours in March.  Other than that she has had no palpitations.  She still prefers as needed use of flecainide and standing Toprol-XL, although actually has not tried the flecainide even with those 2 prior episodes.  No chest pain or syncope. We did discuss the possibility of standing dose flecainide if episodes continued with reasonable frequency.  I went over her medications which are outlined below.  She reports no bleeding problems on Eliquis.  The patient does not have symptoms concerning for COVID-19 infection (fever, chills, cough,  or new shortness of breath).  She has been working in her yard.  When she goes out to shop she wears a mask.   Past Medical History:  Diagnosis Date  . Diverticula of colon   . Paroxysmal atrial fibrillation (HCC)   . Sarcoidosis 1973  . Thyroid nodule    Dr. Caryl Never at [facility]  . Typical atrial flutter [facility])    Past Surgical History:  Procedure Laterality Date  . ABLATION SAPHENOUS VEIN W/ RFA    . BREAST BIOPSY     right  . Cervical node     left bx  . COLONOSCOPY  11/05/2010   Procedure: COLONOSCOPY;  Surgeon: Rogene Houston, MD;  [facility];  Service: Endoscopy;  Laterality: N/A;  9:30 am  . COLONOSCOPY N/A 02/22/2017   Procedure: COLONOSCOPY;  Surgeon: Rogene Houston, MD;  [facility];  Service: Endoscopy;  Laterality: N/A;  730  . HERNIA REPAIR     right  . POLYPECTOMY  02/22/2017   Procedure: POLYPECTOMY;  Surgeon: Rogene Houston, MD;  [facility];  Service: Endoscopy;;  colon   . TOTAL KNEE ARTHROPLASTY  11/02/2011   Procedure: TOTAL KNEE ARTHROPLASTY;  Surgeon: Gearlean Alf, MD;  [facility];  Service: Orthopedics;  Laterality: Left;     Current Meds  Medication Sig  . acetaminophen (TYLENOL) 650 MG CR tablet Take 650 mg daily as needed by mouth for pain.  Marland Kitchen ELIQUIS 5 MG TABS tablet TAKE 1 TABLET BY MOUTH TWICE DAILY  .  flecainide (TAMBOCOR) 100 MG tablet Take 2 tablets (200 mg total) by mouth daily as needed. For atrial fibrillation  . metoprolol succinate (TOPROL XL) 25 MG 24 hr tablet Take 1 tablet in the AM and 2 tablets in the PM     Allergies:   Cardizem [diltiazem hcl]   Social History   Tobacco Use  . Smoking status: Never Smoker  . Smokeless tobacco: Never Used  Substance Use Topics  . Alcohol use: Yes    Alcohol/week: 2.0 standard drinks    Types: 2 Glasses of wine per week    Comment: Wine- Weekends ( maybe )  . Drug use: No     Family Hx: The patient's family history includes Colon cancer in  her mother.  ROS:   Please see the history of present illness.    All other systems reviewed and are negative.   Prior CV studies:   The following studies were reviewed today:  Echocardiogram 10/04/2017: Study Conclusions  - Left ventricle: The cavity size was normal. Wall thickness was increased in a pattern of mild LVH. Systolic function was normal. The estimated ejection fraction was in the range of 60% to 65%. Wall motion was normal; there were no regional wall motion abnormalities. Left ventricular diastolic function parameters were normal.  Labs/Other Tests and Data Reviewed:    EKG:  An ECG dated July 2019 was personally reviewed today and demonstrated:  Sinus rhythm with inferior Q waves and poor R wave progression, low voltage.   Recent Labs: 09/28/2017: BUN 17; Creatinine, Ser 0.83; Hemoglobin 14.6; Magnesium 2.1; Platelets 282; Potassium 3.5; Sodium 139; TSH 3.063    Wt Readings from Last 3 Encounters:  07/24/18 198 lb (89.8 kg)  04/10/18 219 lb (99.3 kg)  01/02/18 214 lb (97.1 kg)     Objective:    Vital Signs:  Pulse 68   Ht 5' 8.5" (1.74 m)   Wt 198 lb (89.8 kg)   BMI 29.67 kg/m    Patient answered questions spontaneously on the phone.  Voice tone and speech pattern were normal.  She was not breathless while speaking in full sentences.  ASSESSMENT & PLAN:    1.  Paroxysmal atrial fibrillation, CHADSVASC score of 2.  We have discussed her symptom control and she prefers to continue with as needed use of flecainide.  Otherwise remain on standing dose of Toprol-XL and Eliquis. Office follow-up in 4 months.  2.  History of sarcoidosis, quiescent at this time.  COVID-19 Education: The signs and symptoms of COVID-19 were discussed with the patient and how to seek care for testing (follow up with PCP or arrange E-visit).  The importance of social distancing was discussed today.  Time:   Today, I have spent 6 minutes with the patient with  telehealth technology discussing the above problems.     Medication Adjustments/Labs and Tests Ordered: Current medicines are reviewed at length with the patient today.  Concerns regarding medicines are outlined above.   Tests Ordered: No orders of the defined types were placed in this encounter.   Medication Changes: No orders of the defined types were placed in this encounter.   Disposition:  Follow up 4 months.  Signed, Rozann Lesches, MD  07/24/2018 10:21 AM    [place]

## 2018-07-24 ENCOUNTER — Encounter: Payer: Self-pay | Admitting: Cardiology

## 2018-07-24 ENCOUNTER — Telehealth (INDEPENDENT_AMBULATORY_CARE_PROVIDER_SITE_OTHER): Payer: Medicare Other | Admitting: Cardiology

## 2018-07-24 VITALS — HR 68 | Ht 68.5 in | Wt 198.0 lb

## 2018-07-24 DIAGNOSIS — D869 Sarcoidosis, unspecified: Secondary | ICD-10-CM | POA: Diagnosis not present

## 2018-07-24 DIAGNOSIS — Z7189 Other specified counseling: Secondary | ICD-10-CM

## 2018-07-24 DIAGNOSIS — I480 Paroxysmal atrial fibrillation: Secondary | ICD-10-CM

## 2018-07-24 NOTE — Patient Instructions (Addendum)
Medication Instructions:   Your physician recommends that you continue on your current medications as directed. Please refer to the Current Medication list given to you today.  Please use your as needed flecainide 100 mg by mouth for atrial fibrillation episodes lasting more than one hour  Labwork:  NONE  Testing/Procedures:  NONE  Follow-Up:  Your physician recommends that you schedule a follow-up appointment in: 4 months.  Any Other Special Instructions Will Be Listed Below (If Applicable).  If you need a refill on your cardiac medications before your next appointment, please call your pharmacy.

## 2018-08-15 DIAGNOSIS — E041 Nontoxic single thyroid nodule: Secondary | ICD-10-CM | POA: Diagnosis not present

## 2018-09-04 DIAGNOSIS — H25011 Cortical age-related cataract, right eye: Secondary | ICD-10-CM | POA: Diagnosis not present

## 2018-09-04 DIAGNOSIS — Z961 Presence of intraocular lens: Secondary | ICD-10-CM | POA: Diagnosis not present

## 2018-09-04 DIAGNOSIS — H2511 Age-related nuclear cataract, right eye: Secondary | ICD-10-CM | POA: Diagnosis not present

## 2018-09-04 DIAGNOSIS — H40013 Open angle with borderline findings, low risk, bilateral: Secondary | ICD-10-CM | POA: Diagnosis not present

## 2018-09-04 DIAGNOSIS — H2589 Other age-related cataract: Secondary | ICD-10-CM | POA: Diagnosis not present

## 2018-09-07 ENCOUNTER — Other Ambulatory Visit (HOSPITAL_COMMUNITY): Payer: Self-pay | Admitting: Cardiology

## 2018-09-12 DIAGNOSIS — H25811 Combined forms of age-related cataract, right eye: Secondary | ICD-10-CM | POA: Diagnosis not present

## 2018-09-12 DIAGNOSIS — H2511 Age-related nuclear cataract, right eye: Secondary | ICD-10-CM | POA: Diagnosis not present

## 2018-09-12 DIAGNOSIS — H25011 Cortical age-related cataract, right eye: Secondary | ICD-10-CM | POA: Diagnosis not present

## 2018-09-26 ENCOUNTER — Other Ambulatory Visit: Payer: Self-pay | Admitting: Internal Medicine

## 2018-09-26 ENCOUNTER — Other Ambulatory Visit: Payer: Self-pay

## 2018-09-26 DIAGNOSIS — Z20822 Contact with and (suspected) exposure to covid-19: Secondary | ICD-10-CM

## 2018-09-28 ENCOUNTER — Other Ambulatory Visit: Payer: Self-pay | Admitting: Internal Medicine

## 2018-10-01 LAB — NOVEL CORONAVIRUS, NAA: SARS-CoV-2, NAA: NOT DETECTED

## 2018-10-16 DIAGNOSIS — Z7189 Other specified counseling: Secondary | ICD-10-CM | POA: Diagnosis not present

## 2018-10-16 DIAGNOSIS — Z0000 Encounter for general adult medical examination without abnormal findings: Secondary | ICD-10-CM | POA: Diagnosis not present

## 2018-10-16 DIAGNOSIS — Z1339 Encounter for screening examination for other mental health and behavioral disorders: Secondary | ICD-10-CM | POA: Diagnosis not present

## 2018-10-16 DIAGNOSIS — Z1331 Encounter for screening for depression: Secondary | ICD-10-CM | POA: Diagnosis not present

## 2018-10-16 DIAGNOSIS — Z6831 Body mass index (BMI) 31.0-31.9, adult: Secondary | ICD-10-CM | POA: Diagnosis not present

## 2018-10-16 DIAGNOSIS — E041 Nontoxic single thyroid nodule: Secondary | ICD-10-CM | POA: Diagnosis not present

## 2018-10-16 DIAGNOSIS — Z299 Encounter for prophylactic measures, unspecified: Secondary | ICD-10-CM | POA: Diagnosis not present

## 2018-10-16 DIAGNOSIS — R5383 Other fatigue: Secondary | ICD-10-CM | POA: Diagnosis not present

## 2018-10-16 DIAGNOSIS — E7800 Pure hypercholesterolemia, unspecified: Secondary | ICD-10-CM | POA: Diagnosis not present

## 2018-10-22 DIAGNOSIS — E041 Nontoxic single thyroid nodule: Secondary | ICD-10-CM | POA: Diagnosis not present

## 2018-10-22 DIAGNOSIS — Z79899 Other long term (current) drug therapy: Secondary | ICD-10-CM | POA: Diagnosis not present

## 2018-10-22 DIAGNOSIS — E7800 Pure hypercholesterolemia, unspecified: Secondary | ICD-10-CM | POA: Diagnosis not present

## 2018-10-22 DIAGNOSIS — R5383 Other fatigue: Secondary | ICD-10-CM | POA: Diagnosis not present

## 2018-11-05 DIAGNOSIS — E2839 Other primary ovarian failure: Secondary | ICD-10-CM | POA: Diagnosis not present

## 2018-11-17 DIAGNOSIS — Z23 Encounter for immunization: Secondary | ICD-10-CM | POA: Diagnosis not present

## 2018-11-26 ENCOUNTER — Ambulatory Visit: Payer: Medicare Other | Admitting: Cardiology

## 2018-12-18 ENCOUNTER — Other Ambulatory Visit: Payer: Self-pay | Admitting: Cardiology

## 2018-12-20 ENCOUNTER — Encounter: Payer: Self-pay | Admitting: Cardiology

## 2018-12-20 ENCOUNTER — Other Ambulatory Visit: Payer: Self-pay

## 2018-12-20 ENCOUNTER — Ambulatory Visit (INDEPENDENT_AMBULATORY_CARE_PROVIDER_SITE_OTHER): Payer: Medicare Other | Admitting: Cardiology

## 2018-12-20 VITALS — BP 119/71 | HR 77 | Temp 96.9°F | Ht 70.0 in | Wt 197.0 lb

## 2018-12-20 DIAGNOSIS — I480 Paroxysmal atrial fibrillation: Secondary | ICD-10-CM | POA: Diagnosis not present

## 2018-12-20 DIAGNOSIS — Z862 Personal history of diseases of the blood and blood-forming organs and certain disorders involving the immune mechanism: Secondary | ICD-10-CM

## 2018-12-20 MED ORDER — METOPROLOL SUCCINATE ER 50 MG PO TB24: 50.0000 mg | ORAL_TABLET | Freq: Two times a day (BID) | ORAL | 3 refills | Status: DC

## 2018-12-20 MED ORDER — FLECAINIDE ACETATE 50 MG PO TABS: 50.0000 mg | ORAL_TABLET | Freq: Two times a day (BID) | ORAL | 3 refills | Status: DC

## 2018-12-20 NOTE — Patient Instructions (Signed)
Medication Instructions:  Take Flecainide 50 mg twice a day  INCREASE Toprol XL to 50 mg twice a day  Labwork: None ordered today  Procedures/Testing:  Get EKG  in the  [place] office 1 week after starting Flecainide twice a day   Follow-Up: 3 months with Dr.McDowell in the [facility].  Any Additional Special Instructions Will Be Listed Below (If Applicable).     If you need a refill on your cardiac medications before your next appointment, please call your pharmacy.

## 2018-12-20 NOTE — Progress Notes (Signed)
Cardiology Office Note  Date: 12/20/2018   ID: Danielle Nicholson, Danielle Nicholson Sep 10, 1945, MRN [id number]  PCP:  Glenda Chroman, MD  Cardiologist:  Rozann Lesches, MD Electrophysiologist:  None   Chief Complaint  Patient presents with  . Cardiac follow-up    History of Present Illness: Danielle Nicholson is a 73 y.o. female last assessed via telehealth encounter in April.  She presents for a routine visit.  Since last evaluation she has had 8 episodes of breakthrough atrial fibrillation, the longest lasted for several hours overnight.  She has been hesitant to take flecainide when these episodes occur. She otherwise continues on Toprol-XL.  Today we talked again about considering standing dose flecainide to better suppress her atrial fibrillation and also increasing her Toprol-XL further.  She otherwise reports no problems with Eliquis.  Past Medical History:  Diagnosis Date  . Diverticula of colon   . Paroxysmal atrial fibrillation (HCC)   . Sarcoidosis 1973  . Thyroid nodule    Dr. Caryl Never at [facility]  . Typical atrial flutter [facility])     Past Surgical History:  Procedure Laterality Date  . ABLATION SAPHENOUS VEIN W/ RFA    . BREAST BIOPSY     right  . Cervical node     left bx  . COLONOSCOPY  11/05/2010   Procedure: COLONOSCOPY;  Surgeon: Rogene Houston, MD;  [facility];  Service: Endoscopy;  Laterality: N/A;  9:30 am  . COLONOSCOPY N/A 02/22/2017   Procedure: COLONOSCOPY;  Surgeon: Rogene Houston, MD;  [facility];  Service: Endoscopy;  Laterality: N/A;  730  . HERNIA REPAIR     right  . POLYPECTOMY  02/22/2017   Procedure: POLYPECTOMY;  Surgeon: Rogene Houston, MD;  [facility];  Service: Endoscopy;;  colon   . TOTAL KNEE ARTHROPLASTY  11/02/2011   Procedure: TOTAL KNEE ARTHROPLASTY;  Surgeon: Gearlean Alf, MD;  [facility];  Service: Orthopedics;  Laterality: Left;    Current Outpatient Medications  Medication Sig Dispense Refill   . acetaminophen (TYLENOL) 650 MG CR tablet Take 650 mg daily as needed by mouth for pain.    Marland Kitchen ELIQUIS 5 MG TABS tablet TAKE 1 TABLET BY MOUTH TWICE DAILY 60 tablet 3  . flecainide (TAMBOCOR) 50 MG tablet Take 1 tablet (50 mg total) by mouth 2 (two) times daily. 180 tablet 3  . metoprolol succinate (TOPROL-XL) 50 MG 24 hr tablet Take 1 tablet (50 mg total) by mouth 2 (two) times daily. Take with or immediately following a meal. 180 tablet 3   No current facility-administered medications for this visit.    Allergies:  Cardizem [diltiazem hcl]   Social History: The patient  reports that she has never smoked. She has never used smokeless tobacco. She reports current alcohol use of about 2.0 standard drinks of alcohol per week. She reports that she does not use drugs.   ROS:  Please see the history of present illness. Otherwise, complete review of systems is positive for none.  All other systems are reviewed and negative.   Physical Exam: VS:  BP 119/71   Pulse 77   Temp (!) 96.9 F (36.1 C)   Ht 5\' 10"  (1.778 m)   Wt 197 lb (89.4 kg)   BMI 28.27 kg/m , BMI Body mass index is 28.27 kg/m.  Wt Readings from Last 3 Encounters:  12/20/18 197 lb (89.4 kg)  07/24/18 198 lb (89.8 kg)  04/10/18 219 lb (99.3 kg)    General: Patient appears comfortable at rest. HEENT: Conjunctiva and lids normal, wearing a mask. Neck: Supple, no elevated JVP or carotid bruits, no thyromegaly. Lungs: Clear to auscultation, nonlabored breathing at rest. Cardiac: Regular rate and rhythm, no S3 or significant systolic murmur, no pericardial rub. Abdomen: Soft, nontender, bowel sounds present. Extremities: No pitting edema, distal pulses 2+. Skin: Warm and dry. Musculoskeletal: No kyphosis. Neuropsychiatric: Alert and oriented x3, affect grossly appropriate.  ECG:  An ECG dated 10/16/2017 was personally reviewed today and demonstrated:  Sinus rhythm with low voltage and poor R wave progression.  Recent  Labwork:  June 2019: TSH 3.06, potassium 3.5, BUN 17, creatinine 0.83, magnesium 2.1, hemoglobin 14.6, platelets 282  Other Studies Reviewed Today:  Echocardiogram 10/04/2017: Study Conclusions  - Left ventricle: The cavity size was normal. Wall thickness was increased in a pattern of mild LVH. Systolic function was normal. The estimated ejection fraction was in the range of 60% to 65%. Wall motion was normal; there were no regional wall motion abnormalities. Left ventricular diastolic function parameters were normal.  Assessment and Plan:  1.  Paroxysmal atrial fibrillation with CHADSVASC score of 2.  She will continue on Eliquis.  Plan to increase Toprol-XL to 50 mg twice daily and start flecainide 50 mg twice daily on a standing basis.  Hopefully this will provide better suppression of breakthrough atrial fibrillation.  She will need a follow-up ECG in 1 week.  Clinical visit in 3 months.  2.  Sarcoidosis by history, currently quiescent.  Medication Adjustments/Labs and Tests Ordered: Current medicines are reviewed at length with the patient today.  Concerns regarding medicines are outlined above.   Tests Ordered: No orders of the defined types were placed in this encounter.   Medication Changes: Meds ordered this encounter  Medications  . flecainide (TAMBOCOR) 50 MG tablet    Sig: Take 1 tablet (50 mg total) by mouth 2 (two) times daily.    Dispense:  180 tablet    Refill:  3  . metoprolol succinate (TOPROL-XL) 50 MG 24 hr tablet    Sig: Take 1 tablet (50 mg total) by mouth 2 (two) times daily. Take with or immediately following a meal.    Dispense:  180 tablet    Refill:  3    Disposition:  Follow up 3 months in the [place] office.  Signed, Satira Sark, MD, [facility] 12/20/2018 3:50 PM    [place] at [facility] [address] [place], [place] [address] Phone: [phone]; Fax: [phone]

## 2018-12-21 ENCOUNTER — Ambulatory Visit: Payer: Medicare Other | Admitting: Cardiology

## 2018-12-28 ENCOUNTER — Other Ambulatory Visit: Payer: Self-pay

## 2018-12-28 ENCOUNTER — Ambulatory Visit (INDEPENDENT_AMBULATORY_CARE_PROVIDER_SITE_OTHER): Payer: Medicare Other | Admitting: *Deleted

## 2018-12-28 DIAGNOSIS — I480 Paroxysmal atrial fibrillation: Secondary | ICD-10-CM

## 2018-12-28 NOTE — Progress Notes (Signed)
Pt aware.

## 2018-12-28 NOTE — Progress Notes (Signed)
ECG was not routed to me but I found in the chart.  She is in sinus rhythm with low voltage, normal QRS and QTc.  Would continue with same treatment plan.

## 2018-12-28 NOTE — Progress Notes (Signed)
Pt here for EKG after starting flecainide 50 mg bid

## 2019-01-21 ENCOUNTER — Telehealth: Payer: Self-pay | Admitting: Cardiology

## 2019-01-21 NOTE — Telephone Encounter (Signed)
Reports having an episode of a-fib last night that was short in duration and her HR was controlled. Wants to know what she can do if this occurs again and when she should get concerned with the duration, and HR parameters. Advised that if she is symptomatic and her heart is racing above 100, that she could take an extra half of her toprol xl 50 mg to equal 25 mg. Advised to contact our office if her HR is consistently above 100 or in the 50's. Verbalized understanding.

## 2019-01-21 NOTE — Telephone Encounter (Signed)
What should she do if Afib last longer than a hour and what rate should she be concerned

## 2019-02-22 ENCOUNTER — Other Ambulatory Visit: Payer: Self-pay

## 2019-02-22 DIAGNOSIS — Z20828 Contact with and (suspected) exposure to other viral communicable diseases: Secondary | ICD-10-CM | POA: Diagnosis not present

## 2019-02-22 DIAGNOSIS — Z20822 Contact with and (suspected) exposure to covid-19: Secondary | ICD-10-CM

## 2019-02-25 LAB — NOVEL CORONAVIRUS, NAA: SARS-CoV-2, NAA: NOT DETECTED

## 2019-03-04 ENCOUNTER — Telehealth (HOSPITAL_COMMUNITY): Payer: Self-pay | Admitting: *Deleted

## 2019-03-04 NOTE — Telephone Encounter (Signed)
appt made by scheduling today

## 2019-03-04 NOTE — Telephone Encounter (Signed)
Patient called to AF Clinic - since starting daily flecainide back in September continues with same amount of afib burden. Episodes can last from 45 mins to 18 hours. Discussed with Roderic Palau NP -- will try increasing flecainide to 75mg  BID -- with repeat EKG in 3 days. Pt would prefer this be done in [place] since she lives a block from the office -- instructed pt to call regarding this option - EKG can be faxed to [phone] for review of intervals. If [place] office unable to do EKG In 3 days pt will let me know. Pt in agreement - if increased dose does not improve afib burden would like to discuss ablation.

## 2019-03-08 ENCOUNTER — Ambulatory Visit (INDEPENDENT_AMBULATORY_CARE_PROVIDER_SITE_OTHER): Payer: Medicare Other | Admitting: *Deleted

## 2019-03-08 ENCOUNTER — Other Ambulatory Visit: Payer: Self-pay

## 2019-03-08 DIAGNOSIS — I480 Paroxysmal atrial fibrillation: Secondary | ICD-10-CM | POA: Diagnosis not present

## 2019-03-08 MED ORDER — FLECAINIDE ACETATE 50 MG PO TABS: 75.0000 mg | ORAL_TABLET | Freq: Two times a day (BID) | ORAL | 3 refills | Status: DC

## 2019-03-08 NOTE — Progress Notes (Signed)
Patient in office this morning for EKG per Doristine Devoid in AFib clinic.

## 2019-03-08 NOTE — Progress Notes (Signed)
EKG reviewed by Malka So, PA. Ok to continue current dose of flecainide at 75mg  twice a day.

## 2019-03-13 NOTE — Progress Notes (Signed)
Patient notified and verbalized understanding. 

## 2019-04-02 ENCOUNTER — Encounter: Payer: Self-pay | Admitting: Cardiology

## 2019-04-02 ENCOUNTER — Ambulatory Visit (INDEPENDENT_AMBULATORY_CARE_PROVIDER_SITE_OTHER): Payer: Medicare Other | Admitting: Cardiology

## 2019-04-02 ENCOUNTER — Other Ambulatory Visit: Payer: Self-pay

## 2019-04-02 VITALS — BP 143/87 | HR 57 | Temp 97.8°F | Ht 70.5 in | Wt 206.0 lb

## 2019-04-02 DIAGNOSIS — I480 Paroxysmal atrial fibrillation: Secondary | ICD-10-CM | POA: Diagnosis not present

## 2019-04-02 NOTE — Patient Instructions (Signed)

## 2019-04-02 NOTE — Progress Notes (Signed)
Cardiology Office Note  Date: 04/02/2019   ID: Yetta, Oke [birth year], MRN [id number]  PCP:  Glenda Chroman, MD  Cardiologist:  Rozann Lesches, MD Electrophysiologist:  None   Chief Complaint  Patient presents with  . Cardiac follow-up    History of Present Illness: Danielle Nicholson is a 73 y.o. female last seen in September.  She presents for a follow-up visit.  Since last encounter flecainide has been increased to 75 mg twice daily.  She does not report any palpitations since 1 December.  I reviewed her interval ECGs, PR interval is prolonged on beta-blocker, but QRS and QTc are normal.  She does not report any bleeding problems on Eliquis.  Past Medical History:  Diagnosis Date  . Diverticula of colon   . Paroxysmal atrial fibrillation (HCC)   . Sarcoidosis 1973  . Thyroid nodule    Dr. Caryl Never at [facility]  . Typical atrial flutter [facility])     Past Surgical History:  Procedure Laterality Date  . ABLATION SAPHENOUS VEIN W/ RFA    . BREAST BIOPSY     right  . Cervical node     left bx  . COLONOSCOPY  11/05/2010   Procedure: COLONOSCOPY;  Surgeon: Rogene Houston, MD;  [facility];  Service: Endoscopy;  Laterality: N/A;  9:30 am  . COLONOSCOPY N/A 02/22/2017   Procedure: COLONOSCOPY;  Surgeon: Rogene Houston, MD;  [facility];  Service: Endoscopy;  Laterality: N/A;  730  . HERNIA REPAIR     right  . POLYPECTOMY  02/22/2017   Procedure: POLYPECTOMY;  Surgeon: Rogene Houston, MD;  [facility];  Service: Endoscopy;;  colon   . TOTAL KNEE ARTHROPLASTY  11/02/2011   Procedure: TOTAL KNEE ARTHROPLASTY;  Surgeon: Gearlean Alf, MD;  [facility];  Service: Orthopedics;  Laterality: Left;    Current Outpatient Medications  Medication Sig Dispense Refill  . acetaminophen (TYLENOL) 650 MG CR tablet Take 650 mg daily as needed by mouth for pain.    Marland Kitchen ELIQUIS 5 MG TABS tablet TAKE 1 TABLET BY MOUTH TWICE DAILY 60 tablet 3  .  flecainide (TAMBOCOR) 50 MG tablet Take 1.5 tablets (75 mg total) by mouth 2 (two) times daily. 90 tablet 3  . metoprolol succinate (TOPROL-XL) 50 MG 24 hr tablet Take 1 tablet (50 mg total) by mouth 2 (two) times daily. Take with or immediately following a meal. 180 tablet 3   No current facility-administered medications for this visit.   Allergies:  Cardizem [diltiazem hcl]   Social History: The patient  reports that she has never smoked. She has never used smokeless tobacco. She reports current alcohol use of about 2.0 standard drinks of alcohol per week. She reports that she does not use drugs.  ROS:  Please see the history of present illness. Otherwise, complete review of systems is positive for none.  All other systems are reviewed and negative.   Physical Exam: VS:  BP (!) 143/87   Pulse (!) 57   Temp 97.8 F (36.6 C)   Ht 5' 10.5" (1.791 m)   Wt 206 lb (93.4 kg) Comment: lots of clothes on  SpO2 97%   BMI 29.14 kg/m , BMI Body mass index is 29.14 kg/m.  Wt Readings from Last 3 Encounters:  04/02/19 206 lb (93.4 kg)  12/20/18 197 lb (89.4 kg)  07/24/18 198 lb (89.8 kg)    General: Patient appears comfortable  at rest. HEENT: Conjunctiva and lids normal, wearing a mask. Neck: Supple, no elevated JVP or carotid bruits, no thyromegaly. Lungs: Clear to auscultation, nonlabored breathing at rest. Cardiac: Regular rate and rhythm, no S3 or significant systolic murmur, no pericardial rub. Extremities: No pitting edema, distal pulses 2+.  ECG:  An ECG dated 03/08/2019 was personally reviewed today and demonstrated:  Sinus bradycardia with prolonged PR interval, normal QRS and QTc.  Recent Labwork:  No interval lab work for review.  Other Studies Reviewed Today:  Echocardiogram 10/04/2017: Study Conclusions  - Left ventricle: The cavity size was normal. Wall thickness was increased in a pattern of mild LVH. Systolic function was normal. The estimated ejection fraction  was in the range of 60% to 65%. Wall motion was normal; there were no regional wall motion abnormalities. Left ventricular diastolic function parameters were normal.  Assessment and Plan:  1.  Paroxysmal atrial fibrillation, CHA2DS2-VASc score of 2.  Continue Eliquis, flecainide at 75 mg twice daily, and Toprol-XL.  She has had no significant palpitations since 1 December.  Interval ECG reviewed.  2.  History of sarcoidosis.  Medication Adjustments/Labs and Tests Ordered: Current medicines are reviewed at length with the patient today.  Concerns regarding medicines are outlined above.   Tests Ordered: No orders of the defined types were placed in this encounter.   Medication Changes: No orders of the defined types were placed in this encounter.   Disposition:  Follow up 6 months in the [place] office.  Signed, Satira Sark, MD, [facility] 04/02/2019 4:20 PM    [place] at [place], [place], [place] [address] Phone: [phone]; Fax: [phone]

## 2019-04-15 ENCOUNTER — Other Ambulatory Visit: Payer: Self-pay | Admitting: Cardiology

## 2019-04-16 ENCOUNTER — Telehealth: Payer: Self-pay | Admitting: Cardiology

## 2019-04-16 NOTE — Telephone Encounter (Signed)
Patient informed and verbalized understanding of plan. 

## 2019-04-16 NOTE — Telephone Encounter (Signed)
Psoriasis is generally an autoimmune process, I am not certain whether Toprol-XL would exacerbate such an issue or not.  Certainly, not a commonly reported problem to me by patients on this medication.  I wonder if she would consider seeing a dermatologist to have them evaluate her, and then if there does exist concern for her staying on Toprol-XL, we could consider adjustment from there.  She does need to be on an AV nodal blocker while on Tambocor.

## 2019-04-16 NOTE — Telephone Encounter (Signed)
Patient called stating that she thinks she has psoriasis. Doing research she states that it could be coming from her beta blockers.

## 2019-04-18 ENCOUNTER — Telehealth: Payer: Self-pay | Admitting: Cardiology

## 2019-04-18 NOTE — Telephone Encounter (Signed)
Called pt.  No answer.  LM on voice mail is it OK for her to have the COVID Vaccine while on Eliquis.  No contraindications.

## 2019-04-18 NOTE — Telephone Encounter (Signed)
COVID-19 Vaccine patient is on Eliquis.. please advise

## 2019-04-19 DIAGNOSIS — Z961 Presence of intraocular lens: Secondary | ICD-10-CM | POA: Diagnosis not present

## 2019-04-19 DIAGNOSIS — H40013 Open angle with borderline findings, low risk, bilateral: Secondary | ICD-10-CM | POA: Diagnosis not present

## 2019-04-19 DIAGNOSIS — H524 Presbyopia: Secondary | ICD-10-CM | POA: Diagnosis not present

## 2019-07-02 ENCOUNTER — Other Ambulatory Visit: Payer: Self-pay | Admitting: *Deleted

## 2019-07-02 MED ORDER — FLECAINIDE ACETATE 50 MG PO TABS: 75.0000 mg | ORAL_TABLET | Freq: Two times a day (BID) | ORAL | 3 refills | Status: DC

## 2019-07-17 DIAGNOSIS — I4891 Unspecified atrial fibrillation: Secondary | ICD-10-CM | POA: Diagnosis not present

## 2019-07-17 DIAGNOSIS — Z299 Encounter for prophylactic measures, unspecified: Secondary | ICD-10-CM | POA: Diagnosis not present

## 2019-07-17 DIAGNOSIS — W57XXXA Bitten or stung by nonvenomous insect and other nonvenomous arthropods, initial encounter: Secondary | ICD-10-CM | POA: Diagnosis not present

## 2019-07-17 DIAGNOSIS — I480 Paroxysmal atrial fibrillation: Secondary | ICD-10-CM | POA: Diagnosis not present

## 2019-07-24 IMAGING — CT CT CHEST W/O CM
2 of 3 series · 15 of 36 positions shown, 18 images · non-contrast
Comparison: CXR 09/28/2017

CLINICAL DATA: Tachycardia, right paratracheal soft tissue
prominence seen on same day chest radiograph. History of
sarcoidosis.

EXAM:
CT CHEST WITHOUT CONTRAST
TECHNIQUE: Multidetector CT imaging of the chest was performed following the
standard protocol without IV contrast.

[Series 2: thorax · axial · 0.62mm/px · z∈[+1281,+1543]mm · 12 of 155 slices shown, 15 images]
[im 12/155  mediastinal]
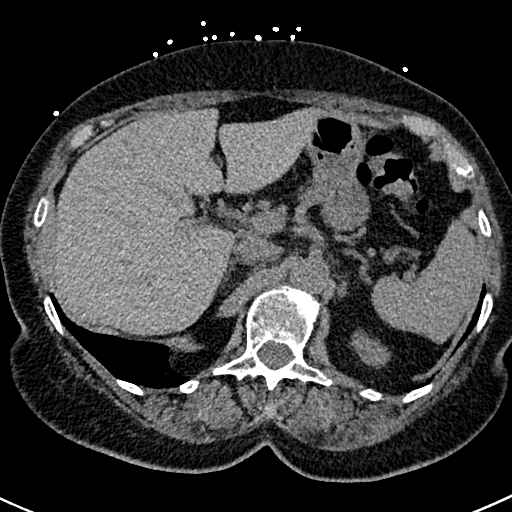
[im 12/155  lung]
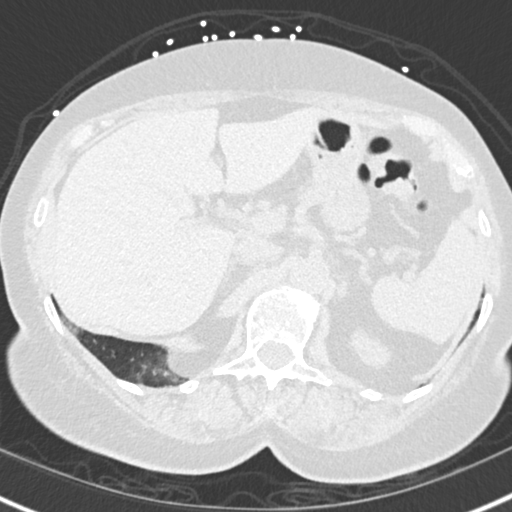
[im 23/155  lung]
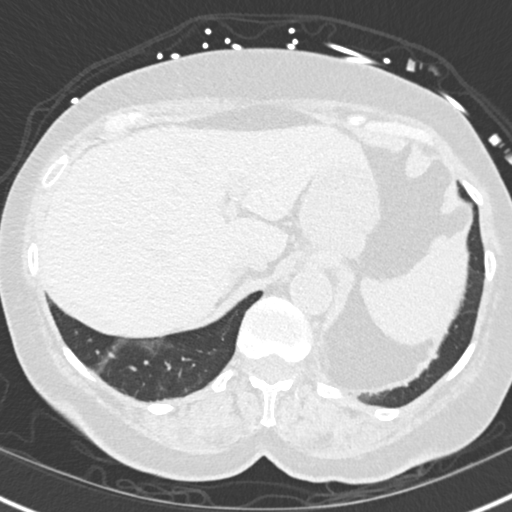
[im 35/155  lung]
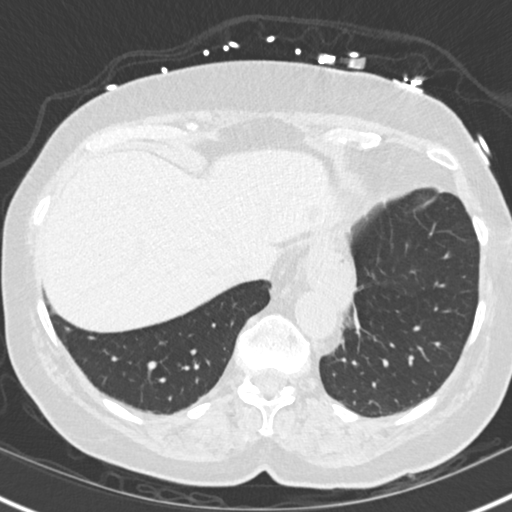
[im 46/155  lung]
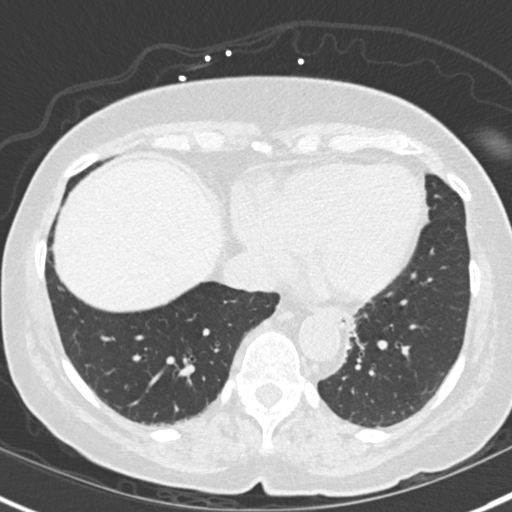
[im 58/155  mediastinal]
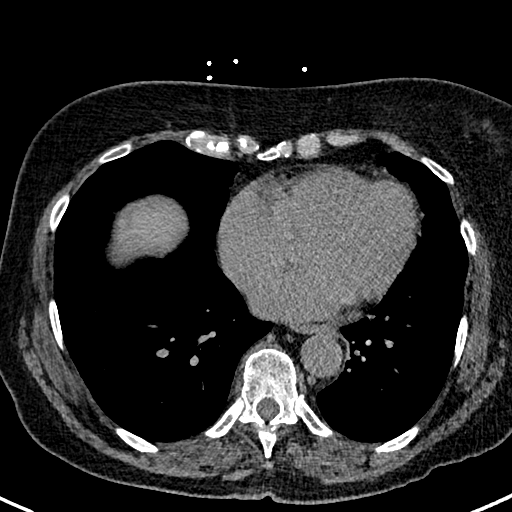
[im 58/155  lung]
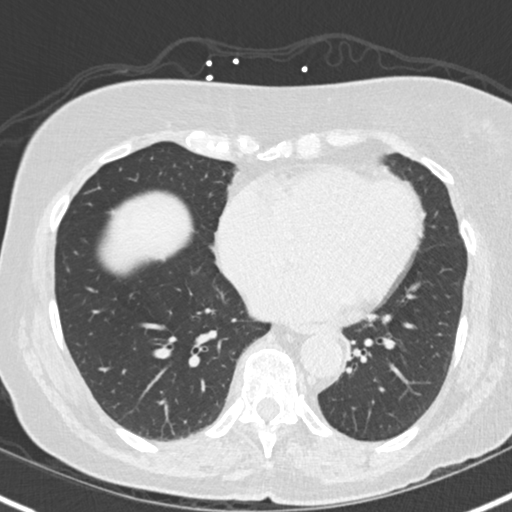
[im 69/155  lung]
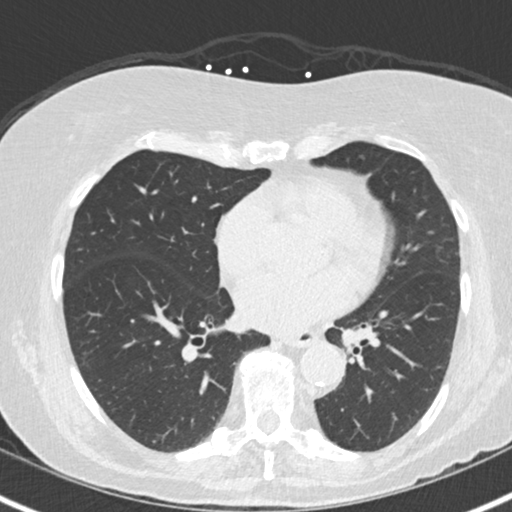
[im 86/155  lung]
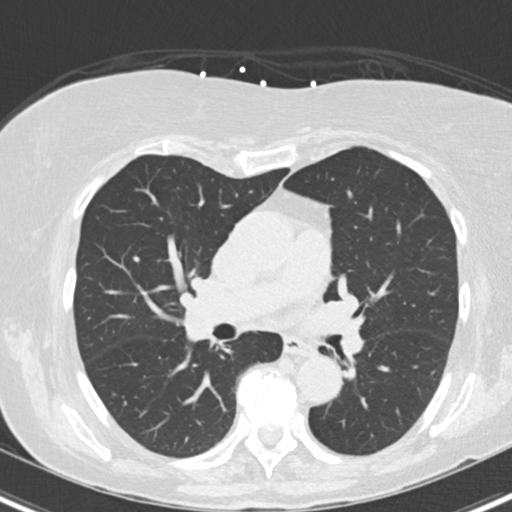
[im 97/155  lung]
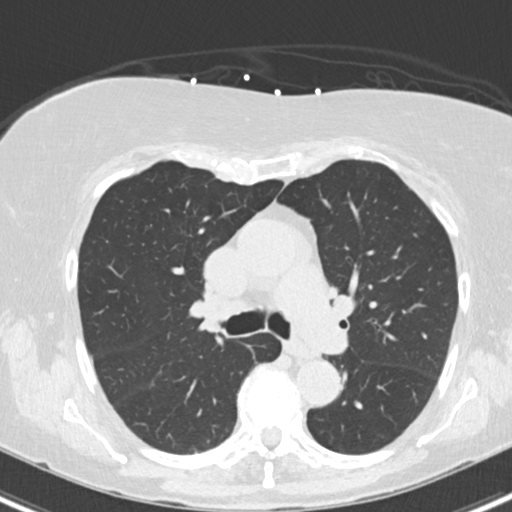
[im 109/155  mediastinal]
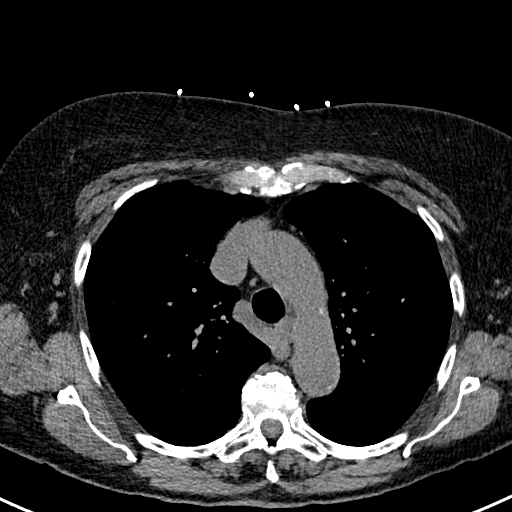
[im 109/155  lung]
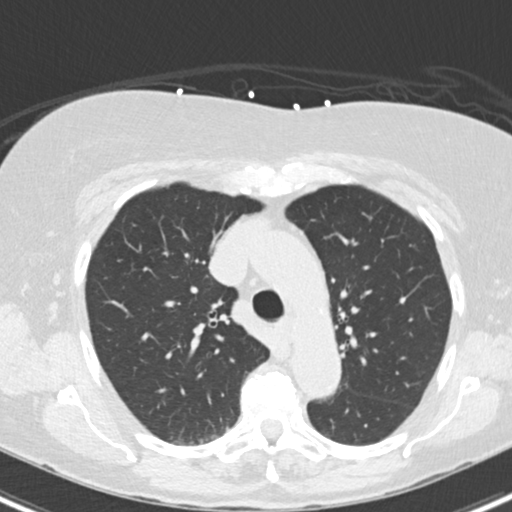
[im 120/155  lung]
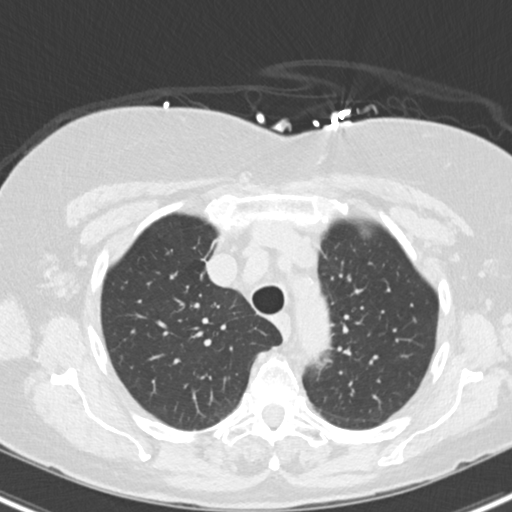
[im 132/155  lung]
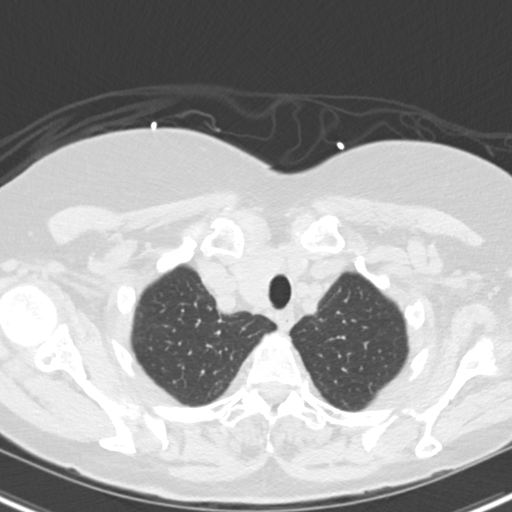
[im 143/155  lung]
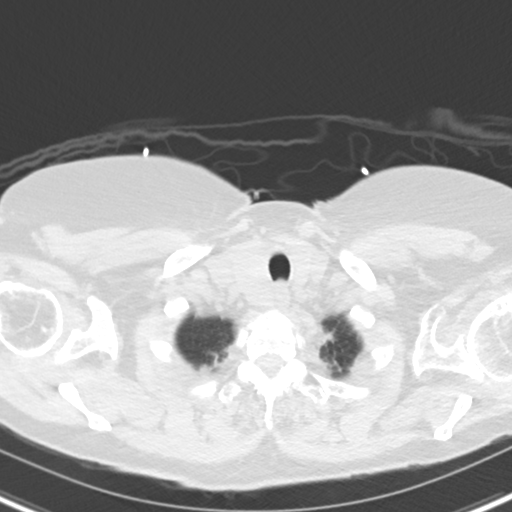

[Series 5: coronal · coronal · 0.61mm/px · 3 of 143 slices shown]
[im 29/143  lung]
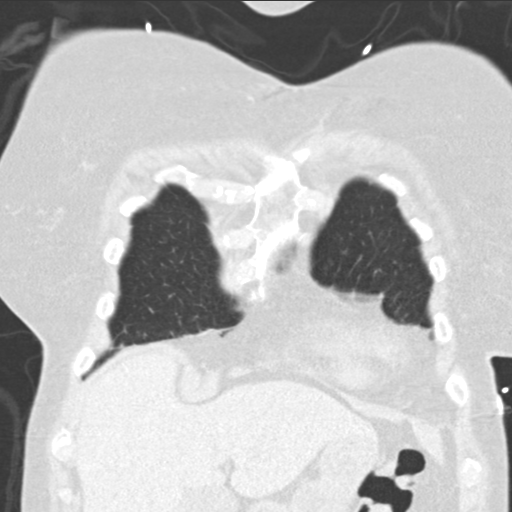
[im 57/143  lung]
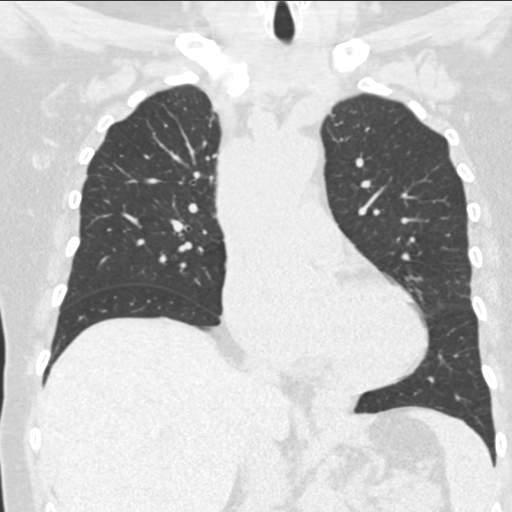
[im 86/143  lung]
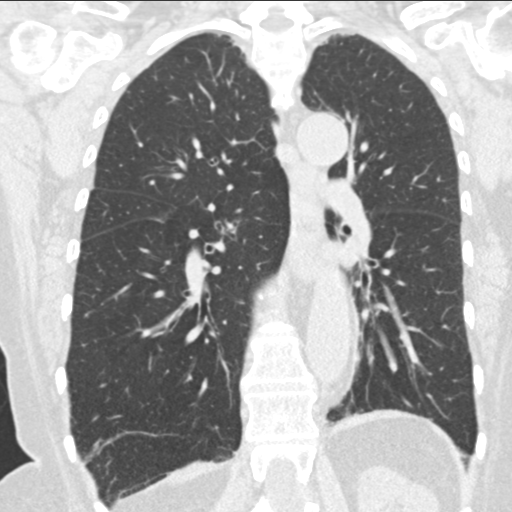

[15 of 36 positions shown; findings below may reference images not displayed]

FINDINGS: Cardiovascular: Conventional branch pattern of the great vessels off
the thoracic aorta. Nonaneurysmal minimally atherosclerotic aorta.
The unenhanced pulmonary vasculature is unremarkable. Normal size
heart without pericardial effusion.

Mediastinum/Nodes: The right paratracheal soft tissue prominence
seen on chest radiograph is secondary to a substernal goiter.
Nonpathologic sized mediastinal lymph nodes. The hila are difficult
to evaluate given lack of IV contrast. Patent trachea and mainstem
bronchi. The esophagus is unremarkable.

Lungs/Pleura: No acute pulmonary consolidation, effusion or
pneumothorax. No dominant mass. Apical subpleural scarring is noted
bilaterally.

Upper Abdomen: No acute abnormality.

Musculoskeletal: Mild thoracic spondylosis. No acute osseous
abnormality.
IMPRESSION: 1. Right paratracheal soft tissue prominence on same day chest
radiograph corresponds with a substernal goiter. No lymphadenopathy.
2. No active pulmonary disease.

Aortic Atherosclerosis ([id number]-ZMC.C).

## 2019-08-21 DIAGNOSIS — E041 Nontoxic single thyroid nodule: Secondary | ICD-10-CM | POA: Diagnosis not present

## 2019-10-18 DIAGNOSIS — H524 Presbyopia: Secondary | ICD-10-CM | POA: Diagnosis not present

## 2019-10-18 DIAGNOSIS — H401430 Capsular glaucoma with pseudoexfoliation of lens, bilateral, stage unspecified: Secondary | ICD-10-CM | POA: Diagnosis not present

## 2019-10-18 DIAGNOSIS — Z961 Presence of intraocular lens: Secondary | ICD-10-CM | POA: Diagnosis not present

## 2019-10-18 DIAGNOSIS — H40013 Open angle with borderline findings, low risk, bilateral: Secondary | ICD-10-CM | POA: Diagnosis not present

## 2019-10-21 ENCOUNTER — Encounter: Payer: Self-pay | Admitting: Cardiology

## 2019-10-21 ENCOUNTER — Ambulatory Visit (INDEPENDENT_AMBULATORY_CARE_PROVIDER_SITE_OTHER): Payer: Medicare Other | Admitting: Cardiology

## 2019-10-21 VITALS — BP 120/70 | HR 62 | Ht 70.5 in | Wt 197.8 lb

## 2019-10-21 DIAGNOSIS — I480 Paroxysmal atrial fibrillation: Secondary | ICD-10-CM

## 2019-10-21 NOTE — Patient Instructions (Signed)
Medication Instructions:  Continue all current medications.  Labwork:  Flecainide level - order given today.   Office will contact with results via phone or letter.    Testing/Procedures: none  Follow-Up: 6 months   Any Other Special Instructions Will Be Listed Below (If Applicable).  If you need a refill on your cardiac medications before your next appointment, please call your pharmacy.

## 2019-10-21 NOTE — Progress Notes (Signed)
Cardiology Office Note  Date: 10/21/2019   ID: Danielle, Nicholson 12-Aug-1945, MRN [id number]  PCP:  Glenda Chroman, MD  Cardiologist:  Rozann Lesches, MD Electrophysiologist:  None   Chief Complaint  Patient presents with  . Cardiac follow-up    History of Present Illness: Danielle Nicholson is a 74 y.o. female last seen in December 2020.  She presents for a routine visit.  She continues to have breakthrough episodes of atrial fibrillation, sometimes very brief, but at least once a month has been having an episode lasting anywhere from 12 to 20 hours.  She states that she continues to do her usual activities, does feel somewhat washed out the next day after converting.  She reports compliance with her medications.  We already increased flecainide to 75 mg twice daily, otherwise on Toprol-XL and Eliquis.  Today she is in sinus rhythm on examination.  Past Medical History:  Diagnosis Date  . Diverticula of colon   . Paroxysmal atrial fibrillation (HCC)   . Sarcoidosis 1973  . Thyroid nodule    [facility]. Caryl Never at [facility]  . Typical atrial flutter [facility])     Past Surgical History:  Procedure Laterality Date  . ABLATION SAPHENOUS VEIN W/ RFA    . BREAST BIOPSY     right  . Cervical node     left bx  . COLONOSCOPY  11/05/2010   Procedure: COLONOSCOPY;  Surgeon: Rogene Houston, MD;  [facility];  Service: Endoscopy;  Laterality: N/A;  9:30 am  . COLONOSCOPY N/A 02/22/2017   Procedure: COLONOSCOPY;  Surgeon: Rogene Houston, MD;  [facility];  Service: Endoscopy;  Laterality: N/A;  730  . HERNIA REPAIR     right  . POLYPECTOMY  02/22/2017   Procedure: POLYPECTOMY;  Surgeon: Rogene Houston, MD;  [facility];  Service: Endoscopy;;  colon   . TOTAL KNEE ARTHROPLASTY  11/02/2011   Procedure: TOTAL KNEE ARTHROPLASTY;  Surgeon: Gearlean Alf, MD;  [facility];  Service: Orthopedics;  Laterality: Left;    Current Outpatient Medications    Medication Sig Dispense Refill  . acetaminophen (TYLENOL) 650 MG CR tablet Take 650 mg daily as needed by mouth for pain.    Marland Kitchen ELIQUIS 5 MG TABS tablet TAKE 1 TABLET BY MOUTH TWICE DAILY 60 tablet 6  . flecainide (TAMBOCOR) 50 MG tablet Take 1.5 tablets (75 mg total) by mouth 2 (two) times daily. 90 tablet 3  . metoprolol succinate (TOPROL-XL) 50 MG 24 hr tablet Take 1 tablet (50 mg total) by mouth 2 (two) times daily. Take with or immediately following a meal. 180 tablet 3   No current facility-administered medications for this visit.   Allergies:  Cardizem [diltiazem hcl]   ROS:  No spontaneous bleeding problems or changes in stool.  Physical Exam: VS:  BP 120/70   Pulse 62   Ht 5' 10.5" (1.791 m)   Wt 197 lb 12.8 oz (89.7 kg)   SpO2 94%   BMI 27.98 kg/m , BMI Body mass index is 27.98 kg/m.  Wt Readings from Last 3 Encounters:  10/21/19 197 lb 12.8 oz (89.7 kg)  04/02/19 206 lb (93.4 kg)  12/20/18 197 lb (89.4 kg)    General: Patient appears comfortable at rest. HEENT: Conjunctiva and lids normal, wearing a mask. Neck: Supple, no elevated JVP or carotid bruits, no thyromegaly. Lungs: Clear to auscultation, nonlabored breathing at rest. Cardiac: Regular rate and  rhythm, no S3 or significant systolic murmur, no pericardial rub. Extremities: No pitting edema, distal pulses 2+.  ECG:  An ECG dated 03/08/2019 was personally reviewed today and demonstrated:  Sinus rhythm with prolonged PR interval.  Recent Labwork:  No interval lab work for review today.  Other Studies Reviewed Today:  Echocardiogram 10/04/2017: Study Conclusions  - Left ventricle: The cavity size was normal. Wall thickness was increased in a pattern of mild LVH. Systolic function was normal. The estimated ejection fraction was in the range of 60% to 65%. Wall motion was normal; there were no regional wall motion abnormalities. Left ventricular diastolic function parameters were  normal.  Assessment and Plan:  Paroxysmal atrial fibrillation with CHA2DS2-VASc score of 3.  She remains on Eliquis for stroke prophylaxis, no reported bleeding problems.  Plan is to check a flecainide level, we may then increase flecainide further to 100 mg twice daily, otherwise continue Toprol-XL.  Ideally, would like to get better symptom control.  If this is not effective, can consider referral back to the atrial fibrillation clinic for discussion of a switch to Tikosyn.  Medication Adjustments/Labs and Tests Ordered: Current medicines are reviewed at length with the patient today.  Concerns regarding medicines are outlined above.   Tests Ordered: Orders Placed This Encounter  Procedures  . Flecainide level    Medication Changes: No orders of the defined types were placed in this encounter.   Disposition:  Follow up 6 months in the [place] office.  Signed, Satira Sark, MD, [facility] 10/21/2019 9:48 AM    [place] at [place], [place], [place] [address] Phone: [phone]; Fax: [phone]

## 2019-10-22 ENCOUNTER — Other Ambulatory Visit (HOSPITAL_COMMUNITY)
Admission: RE | Admit: 2019-10-22 | Discharge: 2019-10-22 | Disposition: A | Discharge status: Home / Self Care | Payer: Medicare Other | Source: Ambulatory Visit | Attending: Cardiology | Admitting: Cardiology

## 2019-10-22 DIAGNOSIS — I480 Paroxysmal atrial fibrillation: Secondary | ICD-10-CM | POA: Insufficient documentation

## 2019-10-23 LAB — FLECAINIDE LEVEL: Flecainide: 0.21 ug/mL (ref 0.20–1.00)

## 2019-10-24 ENCOUNTER — Telehealth: Payer: Self-pay | Admitting: *Deleted

## 2019-10-24 MED ORDER — FLECAINIDE ACETATE 100 MG PO TABS
100.0000 mg | ORAL_TABLET | Freq: Two times a day (BID) | ORAL | 3 refills | Status: DC
Start: 2019-10-24 — End: 2020-10-21

## 2019-10-24 NOTE — Telephone Encounter (Signed)
Patient informed. Copy sent to PCP °

## 2019-10-24 NOTE — Telephone Encounter (Signed)
-----   Message from Satira Sark, MD sent at 10/24/2019  7:58 AM EDT ----- Results reviewed.  Flecainide level is low end of range at 0.21.  Per our recent discussion in clinic, I think we have room to increase her flecainide to 100 mg twice daily, hopefully this will better suppress her atrial fibrillation.  Have her come in for an ECG a week after increase in dose.

## 2019-10-30 ENCOUNTER — Other Ambulatory Visit: Payer: Self-pay | Admitting: Cardiology

## 2019-10-30 DIAGNOSIS — Z7189 Other specified counseling: Secondary | ICD-10-CM | POA: Diagnosis not present

## 2019-10-30 DIAGNOSIS — Z299 Encounter for prophylactic measures, unspecified: Secondary | ICD-10-CM | POA: Diagnosis not present

## 2019-10-30 DIAGNOSIS — R5383 Other fatigue: Secondary | ICD-10-CM | POA: Diagnosis not present

## 2019-10-30 DIAGNOSIS — Z6828 Body mass index (BMI) 28.0-28.9, adult: Secondary | ICD-10-CM | POA: Diagnosis not present

## 2019-10-30 DIAGNOSIS — Z79899 Other long term (current) drug therapy: Secondary | ICD-10-CM | POA: Diagnosis not present

## 2019-10-30 DIAGNOSIS — Z0000 Encounter for general adult medical examination without abnormal findings: Secondary | ICD-10-CM | POA: Diagnosis not present

## 2019-10-30 DIAGNOSIS — Z1331 Encounter for screening for depression: Secondary | ICD-10-CM | POA: Diagnosis not present

## 2019-10-30 DIAGNOSIS — D6869 Other thrombophilia: Secondary | ICD-10-CM | POA: Diagnosis not present

## 2019-10-30 DIAGNOSIS — E7800 Pure hypercholesterolemia, unspecified: Secondary | ICD-10-CM | POA: Diagnosis not present

## 2019-10-30 DIAGNOSIS — Z1339 Encounter for screening examination for other mental health and behavioral disorders: Secondary | ICD-10-CM | POA: Diagnosis not present

## 2019-11-01 ENCOUNTER — Ambulatory Visit (INDEPENDENT_AMBULATORY_CARE_PROVIDER_SITE_OTHER): Payer: Medicare Other | Admitting: *Deleted

## 2019-11-01 DIAGNOSIS — I480 Paroxysmal atrial fibrillation: Secondary | ICD-10-CM

## 2019-11-13 ENCOUNTER — Other Ambulatory Visit: Payer: Self-pay | Admitting: Cardiology

## 2019-12-05 ENCOUNTER — Ambulatory Visit: Payer: Medicare Other | Attending: Internal Medicine

## 2019-12-05 DIAGNOSIS — Z23 Encounter for immunization: Secondary | ICD-10-CM

## 2019-12-05 NOTE — Progress Notes (Signed)
   Covid-19 Vaccination Clinic  Name:  Danielle Nicholson    MRN: [id number] DOB: 11-27-1945  12/05/2019  [name] was observed post Covid-19 immunization for 15 minutes without incident. She was provided with Vaccine Information Sheet and instruction to access the V-Safe system.   [name] was instructed to call 911 with any severe reactions post vaccine: Marland Kitchen Difficulty breathing  . Swelling of face and throat  . A fast heartbeat  . A bad rash all over body  . Dizziness and weakness

## 2019-12-26 ENCOUNTER — Other Ambulatory Visit: Payer: Self-pay | Admitting: Cardiology

## 2020-01-28 DIAGNOSIS — Z299 Encounter for prophylactic measures, unspecified: Secondary | ICD-10-CM | POA: Diagnosis not present

## 2020-01-28 DIAGNOSIS — D485 Neoplasm of uncertain behavior of skin: Secondary | ICD-10-CM | POA: Diagnosis not present

## 2020-01-28 DIAGNOSIS — L928 Other granulomatous disorders of the skin and subcutaneous tissue: Secondary | ICD-10-CM | POA: Diagnosis not present

## 2020-01-28 DIAGNOSIS — L308 Other specified dermatitis: Secondary | ICD-10-CM | POA: Diagnosis not present

## 2020-02-12 DIAGNOSIS — L309 Dermatitis, unspecified: Secondary | ICD-10-CM | POA: Diagnosis not present

## 2020-02-12 DIAGNOSIS — I4891 Unspecified atrial fibrillation: Secondary | ICD-10-CM | POA: Diagnosis not present

## 2020-02-12 DIAGNOSIS — D485 Neoplasm of uncertain behavior of skin: Secondary | ICD-10-CM | POA: Diagnosis not present

## 2020-02-12 DIAGNOSIS — Z299 Encounter for prophylactic measures, unspecified: Secondary | ICD-10-CM | POA: Diagnosis not present

## 2020-04-14 ENCOUNTER — Encounter: Payer: Self-pay | Admitting: Cardiology

## 2020-04-14 ENCOUNTER — Ambulatory Visit: Payer: Medicare Other | Admitting: Cardiology

## 2020-04-14 ENCOUNTER — Other Ambulatory Visit: Payer: Self-pay

## 2020-04-14 VITALS — BP 138/86 | HR 59 | Resp 16 | Ht 70.5 in | Wt 184.0 lb

## 2020-04-14 DIAGNOSIS — I480 Paroxysmal atrial fibrillation: Secondary | ICD-10-CM

## 2020-04-14 NOTE — Patient Instructions (Signed)
Medication Instructions:  °Your physician recommends that you continue on your current medications as directed. Please refer to the Current Medication list given to you today. ° °*If you need a refill on your cardiac medications before your next appointment, please call your pharmacy* ° ° °Lab Work: °None today °If you have labs (blood work) drawn today and your tests are completely normal, you will receive your results only by: °• MyChart Message (if you have MyChart) OR °• A paper copy in the mail °If you have any lab test that is abnormal or we need to change your treatment, we will call you to review the results. ° ° °Testing/Procedures: °None today ° ° °Follow-Up: °At [facility], you and your health needs are our priority.  As part of our continuing mission to provide you with exceptional heart care, we have created designated Provider Care Teams.  These Care Teams include your primary Cardiologist (physician) and Advanced Practice Providers (APPs -  Physician Assistants and Nurse Practitioners) who all work together to provide you with the care you need, when you need it. ° °We recommend signing up for the patient portal called "MyChart".  Sign up information is provided on this After Visit Summary.  MyChart is used to connect with patients for Virtual Visits (Telemedicine).  Patients are able to view lab/test results, encounter notes, upcoming appointments, etc.  Non-urgent messages can be sent to your provider as well.   °To learn more about what you can do with MyChart, go to [email or web address].   ° °Your next appointment:   °6 month(s) ° °The format for your next appointment:   °In Person ° °Provider:   °Samuel McDowell, MD ° ° °Other Instructions °None ° ° ° ° °Thank you for choosing [facility] ! ° ° ° ° ° ° ° ° °

## 2020-04-14 NOTE — Progress Notes (Signed)
Cardiology Office Note  Date: 04/14/2020   ID: Danielle, Nicholson 1945/06/10, MRN [id number]  PCP:  Danielle Chroman, MD  Cardiologist:  Danielle Lesches, MD Electrophysiologist:  None   Chief Complaint  Patient presents with  . Cardiac follow-up    History of Present Illness: Danielle Nicholson is a 75 y.o. female last seen in July 2021.  She presents for a routine visit.  States that she has had 5 episodes of atrial fibrillation since the last encounter, overall better control.  Flecainide dose was increased to 100 mg twice daily since last visit.  I personally reviewed her ECG today which shows sinus bradycardia with prolonged PR interval, normal QRS and QTc.  I reviewed her medications which are outlined below.  She denies any spontaneous bleeding problems.  She will have lab work with Dr. Woody Nicholson this year.  Past Medical History:  Diagnosis Date  . Diverticula of colon   . Paroxysmal atrial fibrillation (HCC)   . Sarcoidosis 1973  . Thyroid nodule    Danielle Nicholson at [facility]  . Typical atrial flutter [facility])     Past Surgical History:  Procedure Laterality Date  . ABLATION SAPHENOUS VEIN W/ RFA    . BREAST BIOPSY     right  . Cervical node     left bx  . COLONOSCOPY  11/05/2010   Procedure: COLONOSCOPY;  Surgeon: Danielle Houston, MD;  [facility];  Service: Endoscopy;  Laterality: N/A;  9:30 am  . COLONOSCOPY N/A 02/22/2017   Procedure: COLONOSCOPY;  Surgeon: Danielle Houston, MD;  [facility];  Service: Endoscopy;  Laterality: N/A;  730  . HERNIA REPAIR     right  . POLYPECTOMY  02/22/2017   Procedure: POLYPECTOMY;  Surgeon: Danielle Houston, MD;  [facility];  Service: Endoscopy;;  colon   . TOTAL KNEE ARTHROPLASTY  11/02/2011   Procedure: TOTAL KNEE ARTHROPLASTY;  Surgeon: Danielle Alf, MD;  [facility];  Service: Orthopedics;  Laterality: Left;    Current Outpatient Medications  Medication Sig Dispense Refill  . acetaminophen  (TYLENOL) 650 MG CR tablet Take 650 mg daily as needed by mouth for pain.    Marland Kitchen ELIQUIS 5 MG TABS tablet TAKE 1 TABLET BY MOUTH TWICE DAILY 60 tablet 6  . flecainide (TAMBOCOR) 100 MG tablet Take 1 tablet (100 mg total) by mouth 2 (two) times daily. 180 tablet 3  . metoprolol succinate (TOPROL-XL) 50 MG 24 hr tablet TAKE 1 TABLET BY MOUTH TWICE DAILY -- 180 tablet 1   No current facility-administered medications for this visit.   Allergies:  Cardizem [diltiazem hcl]   ROS: No dizziness or syncope.  Physical Exam: VS:  BP 138/86   Pulse (!) 59   Resp 16   Ht 5' 10.5" (1.791 m)   Wt 184 lb (83.5 kg)   SpO2 100%   BMI 26.03 kg/m , BMI Body mass index is 26.03 kg/m.  Wt Readings from Last 3 Encounters:  04/14/20 184 lb (83.5 kg)  10/21/19 197 lb 12.8 oz (89.7 kg)  04/02/19 206 lb (93.4 kg)    General: Patient appears comfortable at rest. HEENT: Conjunctiva and lids normal, wearing a mask. Neck: Supple, no elevated JVP or carotid bruits, no thyromegaly. Lungs: Clear to auscultation, nonlabored breathing at rest. Cardiac: Regular rate and rhythm, no S3 or significant systolic murmur, no pericardial rub. Extremities: No pitting edema.  ECG:  An ECG dated  11/01/2019 was personally reviewed today and demonstrated:  Sinus rhythm with prolonged PR interval.  Recent Labwork:  July 2021: Flecainide level 0.21  Other Studies Reviewed Today:  Echocardiogram 10/04/2017: Study Conclusions  - Left ventricle: The cavity size was normal. Wall thickness was increased in a pattern of mild LVH. Systolic function was normal. The estimated ejection fraction was in the range of 60% to 65%. Wall motion was normal; there were no regional wall motion abnormalities. Left ventricular diastolic function parameters were normal.  Assessment and Plan:  Paroxysmal atrial fibrillation.  CHA2DS2-VASc score is 3.  Symptom control reasonable at this time following increase in flecainide to 100  mg twice daily.  She continues on Toprol-XL at stable dose, also Eliquis for stroke prophylaxis.  ECG reviewed.  Would continue with current plan.  If symptoms escalate can always consider referral to the atrial fibrillation clinic for discussion of switch to Tikosyn or potentially ablation.  Medication Adjustments/Labs and Tests Ordered: Current medicines are reviewed at length with the patient today.  Concerns regarding medicines are outlined above.   Tests Ordered: Orders Placed This Encounter  Procedures  . EKG 12-Lead    Medication Changes: No orders of the defined types were placed in this encounter.   Disposition:  Follow up 6 months.  Signed, Danielle Sark, MD, [facility] 04/14/2020 8:44 AM    [facility] at [facility] [address] [place], [place] [address] Phone: [phone]; Fax: [phone]

## 2020-04-21 ENCOUNTER — Ambulatory Visit: Payer: Medicare Other | Admitting: Cardiology

## 2020-04-24 ENCOUNTER — Telehealth: Payer: Self-pay | Admitting: Cardiology

## 2020-04-24 NOTE — Telephone Encounter (Signed)
Danielle Nicholson called stating that her visit of 04-14-2020 was filed incorrectly. She no longer has [employer]. She is requesting that the bill be re-submitted to [facility] [employer].

## 2020-04-27 NOTE — Telephone Encounter (Signed)
Encounter has been corrected & filed to correct ins coverage.

## 2020-05-15 DIAGNOSIS — Z789 Other specified health status: Secondary | ICD-10-CM | POA: Diagnosis not present

## 2020-05-15 DIAGNOSIS — Z299 Encounter for prophylactic measures, unspecified: Secondary | ICD-10-CM | POA: Diagnosis not present

## 2020-05-15 DIAGNOSIS — Z713 Dietary counseling and surveillance: Secondary | ICD-10-CM | POA: Diagnosis not present

## 2020-05-15 DIAGNOSIS — J069 Acute upper respiratory infection, unspecified: Secondary | ICD-10-CM | POA: Diagnosis not present

## 2020-05-18 DIAGNOSIS — H40013 Open angle with borderline findings, low risk, bilateral: Secondary | ICD-10-CM | POA: Diagnosis not present

## 2020-05-18 DIAGNOSIS — H04123 Dry eye syndrome of bilateral lacrimal glands: Secondary | ICD-10-CM | POA: Diagnosis not present

## 2020-05-18 DIAGNOSIS — H43812 Vitreous degeneration, left eye: Secondary | ICD-10-CM | POA: Diagnosis not present

## 2020-05-18 DIAGNOSIS — Z961 Presence of intraocular lens: Secondary | ICD-10-CM | POA: Diagnosis not present

## 2020-05-18 DIAGNOSIS — H524 Presbyopia: Secondary | ICD-10-CM | POA: Diagnosis not present

## 2020-05-20 ENCOUNTER — Telehealth: Payer: Self-pay | Admitting: Cardiology

## 2020-05-20 NOTE — Telephone Encounter (Signed)
Patient came into the office today with another bill. It is stating that the bill was sent to [place]. I have scanned in her new card [employer]. She is requesting that the bill be refilled  To [place].

## 2020-06-11 ENCOUNTER — Other Ambulatory Visit: Payer: Self-pay | Admitting: Cardiology

## 2020-06-11 NOTE — Telephone Encounter (Signed)
Prescription refill request for Eliquis received. Indication: A Fib Last office visit:  04/14/20 Scr: 0.86 Age: 75 Weight:  83.5kg  Based on above findings Eliquis 5mg  twice daily is the appropriate dose.  Refill approved.

## 2020-06-24 ENCOUNTER — Other Ambulatory Visit: Payer: Self-pay | Admitting: Cardiology

## 2020-09-02 DIAGNOSIS — E041 Nontoxic single thyroid nodule: Secondary | ICD-10-CM | POA: Diagnosis not present

## 2020-09-29 ENCOUNTER — Ambulatory Visit: Payer: Medicare Other | Admitting: Cardiology

## 2020-09-29 ENCOUNTER — Encounter: Payer: Self-pay | Admitting: Cardiology

## 2020-09-29 ENCOUNTER — Other Ambulatory Visit: Payer: Self-pay

## 2020-09-29 VITALS — BP 108/62 | HR 60 | Ht 70.0 in | Wt 178.0 lb

## 2020-09-29 DIAGNOSIS — I480 Paroxysmal atrial fibrillation: Secondary | ICD-10-CM

## 2020-09-29 DIAGNOSIS — D6869 Other thrombophilia: Secondary | ICD-10-CM

## 2020-09-29 NOTE — Progress Notes (Signed)
Cardiology Office Note  Date: 09/29/2020   ID: Ahnesti, Townsend [birth year], MRN [id number]  PCP:  Glenda Chroman, MD  Cardiologist:  Rozann Lesches, MD Electrophysiologist:  None   Chief Complaint  Patient presents with   Cardiac follow-up     History of Present Illness: Danielle Nicholson is a 75 y.o. female last seen in January.  She is here for a routine visit.  Since last encounter she has had 3 episodes of atrial fibrillation, overall satisfied with rhythm control at this time.  I reviewed her medications, no changes noted.  She does not describe any spontaneous bleeding problems on Eliquis.  Past Medical History:  Diagnosis Date   Diverticula of colon    Paroxysmal atrial fibrillation [facility])    Sarcoidosis 1973   Thyroid nodule    Dr. Caryl Never at [facility]   Typical atrial flutter [facility])     Past Surgical History:  Procedure Laterality Date   ABLATION SAPHENOUS VEIN W/ RFA     BREAST BIOPSY     right   Cervical node     left bx   COLONOSCOPY  11/05/2010   Procedure: COLONOSCOPY;  Surgeon: Rogene Houston, MD;  [facility];  Service: Endoscopy;  Laterality: N/A;  9:30 am   COLONOSCOPY N/A 02/22/2017   Procedure: COLONOSCOPY;  Surgeon: Rogene Houston, MD;  [facility];  Service: Endoscopy;  Laterality: N/A;  [place]     right   POLYPECTOMY  02/22/2017   Procedure: POLYPECTOMY;  Surgeon: Rogene Houston, MD;  [facility];  Service: Endoscopy;;  colon    TOTAL KNEE ARTHROPLASTY  11/02/2011   Procedure: TOTAL KNEE ARTHROPLASTY;  Surgeon: Gearlean Alf, MD;  [facility];  Service: Orthopedics;  Laterality: Left;    Current Outpatient Medications  Medication Sig Dispense Refill   acetaminophen (TYLENOL) 650 MG CR tablet Take 650 mg daily as needed by mouth for pain.     ELIQUIS 5 MG TABS tablet TAKE 1 TABLET BY MOUTH TWICE DAILY 60 tablet 6   flecainide (TAMBOCOR) 100 MG tablet Take 1 tablet (100 mg total) by  mouth 2 (two) times daily. 180 tablet 3   metoprolol succinate (TOPROL-XL) 50 MG 24 hr tablet TAKE 1 TABLET BY MOUTH TWICE DAILY 180 tablet 1   No current facility-administered medications for this visit.   Allergies:  Cardizem [diltiazem hcl]   ROS: No frank syncope.  Physical Exam: VS:  BP 108/62   Pulse 60   Ht 5\' 10"  (1.778 m)   Wt 178 lb (80.7 kg)   SpO2 98%   BMI 25.54 kg/m , BMI Body mass index is 25.54 kg/m.  Wt Readings from Last 3 Encounters:  09/29/20 178 lb (80.7 kg)  04/14/20 184 lb (83.5 kg)  10/21/19 197 lb 12.8 oz (89.7 kg)    General: Patient appears comfortable at rest. HEENT: Conjunctiva and lids normal, wearing a mask. Neck: Supple, no elevated JVP or carotid bruits, no thyromegaly. Lungs: Clear to auscultation, nonlabored breathing at rest. Cardiac: Regular rate and rhythm, no S3 or significant systolic murmur, no pericardial rub. Extremities: No pitting edema.  ECG:  An ECG dated 04/14/2020 was personally reviewed today and demonstrated:  Sinus bradycardia with prolonged PR interval, normal QRS and QTc.  Recent Labwork:  No interval lab work for review today.  Other Studies Reviewed Today:  Echocardiogram 10/04/2017: Study Conclusions   - Left ventricle:  The cavity size was normal. Wall thickness was   increased in a pattern of mild LVH. Systolic function was normal.   The estimated ejection fraction was in the range of 60% to 65%.   Wall motion was normal; there were no regional wall motion   abnormalities. Left ventricular diastolic function parameters   were normal.  Assessment and Plan:  1.  Paroxysmal atrial fibrillation with CHA2DS2-VASc score of 3.  Reports adequate symptom control on current regimen including Toprol-XL and flecainide.  2.  Acquired thrombophilia, on Eliquis for stroke prophylaxis.  No reported bleeding problems.  Continues to follow lab work with Dr. Woody Seller.  Medication Adjustments/Labs and Tests Ordered: Current  medicines are reviewed at length with the patient today.  Concerns regarding medicines are outlined above.   Tests Ordered: No orders of the defined types were placed in this encounter.   Medication Changes: No orders of the defined types were placed in this encounter.   Disposition:  Follow up  6 months.  Signed, Satira Sark, MD, [facility] 09/29/2020 9:50 AM    [place] [facility] [place] [place], [place] [address] Phone: [phone]; Fax: [phone]

## 2020-09-29 NOTE — Patient Instructions (Signed)

## 2020-10-21 ENCOUNTER — Other Ambulatory Visit: Payer: Self-pay | Admitting: *Deleted

## 2020-10-21 MED ORDER — FLECAINIDE ACETATE 100 MG PO TABS: 100.0000 mg | ORAL_TABLET | Freq: Two times a day (BID) | ORAL | 3 refills | Status: DC

## 2020-11-03 DIAGNOSIS — Z7189 Other specified counseling: Secondary | ICD-10-CM | POA: Diagnosis not present

## 2020-11-03 DIAGNOSIS — Z6824 Body mass index (BMI) 24.0-24.9, adult: Secondary | ICD-10-CM | POA: Diagnosis not present

## 2020-11-03 DIAGNOSIS — Z299 Encounter for prophylactic measures, unspecified: Secondary | ICD-10-CM | POA: Diagnosis not present

## 2020-11-03 DIAGNOSIS — Z0000 Encounter for general adult medical examination without abnormal findings: Secondary | ICD-10-CM | POA: Diagnosis not present

## 2020-11-04 DIAGNOSIS — Z79899 Other long term (current) drug therapy: Secondary | ICD-10-CM | POA: Diagnosis not present

## 2020-11-04 DIAGNOSIS — R5383 Other fatigue: Secondary | ICD-10-CM | POA: Diagnosis not present

## 2020-11-04 DIAGNOSIS — E7800 Pure hypercholesterolemia, unspecified: Secondary | ICD-10-CM | POA: Diagnosis not present

## 2020-11-19 DIAGNOSIS — Z299 Encounter for prophylactic measures, unspecified: Secondary | ICD-10-CM | POA: Diagnosis not present

## 2020-11-19 DIAGNOSIS — N390 Urinary tract infection, site not specified: Secondary | ICD-10-CM | POA: Diagnosis not present

## 2020-11-19 DIAGNOSIS — D6869 Other thrombophilia: Secondary | ICD-10-CM | POA: Diagnosis not present

## 2020-11-19 DIAGNOSIS — R350 Frequency of micturition: Secondary | ICD-10-CM | POA: Diagnosis not present

## 2020-11-19 DIAGNOSIS — I4891 Unspecified atrial fibrillation: Secondary | ICD-10-CM | POA: Diagnosis not present

## 2020-12-18 DIAGNOSIS — N390 Urinary tract infection, site not specified: Secondary | ICD-10-CM | POA: Diagnosis not present

## 2020-12-18 DIAGNOSIS — Z299 Encounter for prophylactic measures, unspecified: Secondary | ICD-10-CM | POA: Diagnosis not present

## 2020-12-21 ENCOUNTER — Other Ambulatory Visit: Payer: Self-pay | Admitting: Cardiology

## 2021-01-10 ENCOUNTER — Other Ambulatory Visit: Payer: Self-pay | Admitting: Cardiology

## 2021-01-11 NOTE — Telephone Encounter (Signed)
Prescription refill request for Eliquis received. Indication: PAF Last office visit: 09/29/20  Myles Gip MD Scr: 0.86 on 10/30/19 Age: 75 Weight: 80.7kg  Based on above findings Eliquis 5mg  twice daily is the appropriate dose.  Reill approved.  Pt is due for CBC/BMP.  Requested labs be done at upcoming office visit with Dr Domenic Polite.

## 2021-01-12 ENCOUNTER — Ambulatory Visit: Payer: Medicare Other | Admitting: Cardiology

## 2021-01-15 DIAGNOSIS — L570 Actinic keratosis: Secondary | ICD-10-CM | POA: Diagnosis not present

## 2021-02-15 DIAGNOSIS — H04123 Dry eye syndrome of bilateral lacrimal glands: Secondary | ICD-10-CM | POA: Diagnosis not present

## 2021-02-15 DIAGNOSIS — H40013 Open angle with borderline findings, low risk, bilateral: Secondary | ICD-10-CM | POA: Diagnosis not present

## 2021-04-06 NOTE — Progress Notes (Signed)
Cardiology Office Note  Date: 04/07/2021   ID: Danielle, Nicholson [birth year], MRN [id number]  PCP:  Danielle Chroman, MD  Cardiologist:  Danielle Lesches, MD Electrophysiologist:  None   Chief Complaint  Patient presents with   Cardiac follow-up    History of Present Illness: Danielle Nicholson is a 76 y.o. female last seen in June 2022.  She is here for a follow-up visit.  In the last 6 months she has had 4 episodes of breakthrough atrial fibrillation based on symptoms.  Also, she had an episode of syncope while in the shower back in November, occurred suddenly, she could tell something was getting ready to happen.  Fortunately, no injury.  Since then she had an episode where she felt as if she might blackout but sat down and avoided it.  I personally reviewed her ECG today which shows sinus bradycardia with normal intervals.  We discussed arranging follow-up lab work on Eliquis, no spontaneous bleeding problems reported.  Cardiac regimen is stable as outlined below.  Past Medical History:  Diagnosis Date   Diverticula of colon    Paroxysmal atrial fibrillation [facility])    Sarcoidosis 1973   Thyroid nodule    Dr. Caryl Nicholson at [facility]   Typical atrial flutter [facility])     Past Surgical History:  Procedure Laterality Date   ABLATION SAPHENOUS VEIN W/ RFA     BREAST BIOPSY     right   Cervical node     left bx   COLONOSCOPY  11/05/2010   Procedure: COLONOSCOPY;  Surgeon: Danielle Houston, MD;  [facility];  Service: Endoscopy;  Laterality: N/A;  9:30 am   COLONOSCOPY N/A 02/22/2017   Procedure: COLONOSCOPY;  Surgeon: Danielle Houston, MD;  [facility];  Service: Endoscopy;  Laterality: N/A;  [place]     right   POLYPECTOMY  02/22/2017   Procedure: POLYPECTOMY;  Surgeon: Danielle Houston, MD;  [facility];  Service: Endoscopy;;  colon    TOTAL KNEE ARTHROPLASTY  11/02/2011   Procedure: TOTAL KNEE ARTHROPLASTY;  Surgeon: Danielle Alf, MD;   [facility];  Service: Orthopedics;  Laterality: Left;    Current Outpatient Medications  Medication Sig Dispense Refill   acetaminophen (TYLENOL) 650 MG CR tablet Take 650 mg daily as needed by mouth for pain.     apixaban (ELIQUIS) 5 MG TABS tablet TAKE 1 TABLET BY MOUTH TWICE DAILY 60 tablet 4   flecainide (TAMBOCOR) 100 MG tablet Take 1 tablet (100 mg total) by mouth 2 (two) times daily. 180 tablet 3   metoprolol succinate (TOPROL-XL) 50 MG 24 hr tablet TAKE 1 TABLET BY MOUTH TWICE DAILY 180 tablet 1   No current facility-administered medications for this visit.   Allergies:  Cardizem [diltiazem hcl]   ROS: No orthopnea or PND.  No exertional chest pain.  Physical Exam: VS:  BP 118/72    Pulse 60    Ht 5' 10.5" (1.791 m)    Wt 167 lb (75.8 kg)    SpO2 97%    BMI 23.62 kg/m , BMI Body mass index is 23.62 kg/m.  Wt Readings from Last 3 Encounters:  04/07/21 167 lb (75.8 kg)  09/29/20 178 lb (80.7 kg)  04/14/20 184 lb (83.5 kg)    General: Patient appears comfortable at rest. HEENT: Conjunctiva and lids normal, wearing a mask. Neck: Supple, no elevated JVP or carotid bruits, no thyromegaly.  Lungs: Clear to auscultation, nonlabored breathing at rest. Cardiac: Regular rate and rhythm, no S3 or significant systolic murmur, no pericardial rub. Extremities: No pitting edema.  ECG:  An ECG dated 04/14/2020 was personally reviewed today and demonstrated:  Sinus bradycardia with prolonged PR interval.  Recent Labwork:  No interval lab work for review today.  Other Studies Reviewed Today:  Echocardiogram 10/04/2017: Study Conclusions   - Left ventricle: The cavity size was normal. Wall thickness was   increased in a pattern of mild LVH. Systolic function was normal.   The estimated ejection fraction was in the range of 60% to 65%.   Wall motion was normal; there were no regional wall motion   abnormalities. Left ventricular diastolic function parameters   were  normal.  Assessment and Plan:  1.  Paroxysmal atrial fibrillation and history of typical atrial flutter with CHA2DS2-VASc score of 3.  She is in sinus rhythm today, has had 4 episodes of breakthrough arrhythmia based on symptoms in the last 6 months.  She continues on flecainide, Toprol-XL, and Eliquis.  Check CBC and BMET.  ECG today shows normal intervals.  2.  Episode of syncope back in November 2022 as discussed above.  Since then episode of presyncope.  No provocation for either event is obvious.  Not occurring on a regular basis.  Would be concerned about potential sick sinus syndrome and pauses.  Also need to exclude proarrhythmia.  We will obtain a GXT on her current cardiac regimen (do not hold any medications for her test).  Also referral to EP to discuss ILR as this would be more likely a better way to capture potential arrhythmia/pauses and also provide a way to assess atrial fibrillation control more objectively.  Medication Adjustments/Labs and Tests Ordered: Current medicines are reviewed at length with the patient today.  Concerns regarding medicines are outlined above.   Tests Ordered: Orders Placed This Encounter  Procedures   Basic metabolic panel   CBC   Ambulatory referral to Cardiac Electrophysiology   EXERCISE TOLERANCE TEST (ETT)    Medication Changes: No orders of the defined types were placed in this encounter.   Disposition:  Follow up  3 months.  Signed, Danielle Sark, MD, [facility] 04/07/2021 8:52 AM    [place] at [place], [place], [place] [address] Phone: [phone]; Fax: [phone]

## 2021-04-07 ENCOUNTER — Ambulatory Visit: Payer: Medicare Other | Admitting: Cardiology

## 2021-04-07 ENCOUNTER — Encounter: Payer: Self-pay | Admitting: Cardiology

## 2021-04-07 ENCOUNTER — Telehealth: Payer: Self-pay | Admitting: Cardiology

## 2021-04-07 ENCOUNTER — Other Ambulatory Visit: Payer: Self-pay

## 2021-04-07 ENCOUNTER — Encounter: Payer: Self-pay | Admitting: *Deleted

## 2021-04-07 VITALS — BP 118/72 | HR 60 | Ht 70.5 in | Wt 167.0 lb

## 2021-04-07 DIAGNOSIS — Z87898 Personal history of other specified conditions: Secondary | ICD-10-CM

## 2021-04-07 DIAGNOSIS — Z79899 Other long term (current) drug therapy: Secondary | ICD-10-CM | POA: Diagnosis not present

## 2021-04-07 DIAGNOSIS — I480 Paroxysmal atrial fibrillation: Secondary | ICD-10-CM

## 2021-04-07 NOTE — Patient Instructions (Addendum)
Medication Instructions:  Your physician recommends that you continue on your current medications as directed. Please refer to the Current Medication list given to you today.  Labwork: BMET & CBC at [facility] or [facility] or [employer] [facility] in [place]. Non-fasting  Testing/Procedures: Your physician has requested that you have an exercise tolerance test. For further information please visit [email or web address]. Please also follow instruction sheet, as given.  Follow-Up: Your physician recommends that you schedule a follow-up appointment in: 3 months  Any Other Special Instructions Will Be Listed Below (If Applicable). You have been referred to an Electrophysiologist Cardiologist  If you need a refill on your cardiac medications before your next appointment, please call your pharmacy.

## 2021-04-07 NOTE — Telephone Encounter (Signed)
Checking percert on the following patient for testing scheduled at [facility].     GXT  04/09/2021

## 2021-04-07 NOTE — Addendum Note (Signed)
Addended by: Laurine Blazer on: 04/07/2021 08:57 AM   Modules accepted: Orders

## 2021-04-09 ENCOUNTER — Other Ambulatory Visit (HOSPITAL_COMMUNITY)
Admission: RE | Admit: 2021-04-09 | Discharge: 2021-04-09 | Disposition: A | Discharge status: Home / Self Care | Payer: Medicare Other | Source: Ambulatory Visit | Attending: Cardiology | Admitting: Cardiology

## 2021-04-09 ENCOUNTER — Ambulatory Visit (HOSPITAL_COMMUNITY)
Admission: RE | Admit: 2021-04-09 | Discharge: 2021-04-09 | Disposition: A | Discharge status: Home / Self Care | Payer: Medicare Other | Source: Ambulatory Visit | Attending: Cardiology | Admitting: Cardiology

## 2021-04-09 ENCOUNTER — Other Ambulatory Visit: Payer: Self-pay

## 2021-04-09 DIAGNOSIS — I480 Paroxysmal atrial fibrillation: Secondary | ICD-10-CM | POA: Insufficient documentation

## 2021-04-09 DIAGNOSIS — Z79899 Other long term (current) drug therapy: Secondary | ICD-10-CM | POA: Insufficient documentation

## 2021-04-09 LAB — EXERCISE TOLERANCE TEST
Angina Index: 0
Duke Treadmill Score: 6
Estimated workload: 7
Exercise duration (min): 5 min
Exercise duration (sec): 34 s
MPHR: 144 {beats}/min
Peak HR: 92 {beats}/min
Percent HR: 63 %
RPE: 13
Rest HR: 58 {beats}/min
ST Depression (mm): 0 mm

## 2021-04-09 LAB — BASIC METABOLIC PANEL
Anion gap: 7 (ref 5–15)
BUN: 19 mg/dL (ref 8–23)
CO2: 30 mmol/L (ref 22–32)
Calcium: 9.2 mg/dL (ref 8.9–10.3)
Chloride: 103 mmol/L (ref 98–111)
Creatinine, Ser: 0.81 mg/dL (ref 0.44–1.00)
GFR, Estimated: >60 mL/min (ref >60)
Glucose, Bld: 99 mg/dL (ref 70–99)
Potassium: 4.3 mmol/L (ref 3.5–5.1)
Sodium: 140 mmol/L (ref 135–145)

## 2021-04-09 LAB — CBC
HCT: 44.4 % (ref 36.0–46.0)
Hemoglobin: 14.2 g/dL (ref 12.0–15.0)
MCH: 31.3 pg (ref 26.0–34.0)
MCHC: 32 g/dL (ref 30.0–36.0)
MCV: 98 fL (ref 80.0–100.0)
Platelets: 210 10*3/uL (ref 150–400)
RBC: 4.53 MIL/uL (ref 3.87–5.11)
RDW: 12.4 % (ref 11.5–15.5)
WBC: 7.7 10*3/uL (ref 4.0–10.5)
nRBC: 0 % (ref 0.0–0.2)

## 2021-04-12 ENCOUNTER — Telehealth: Payer: Self-pay | Admitting: *Deleted

## 2021-04-12 NOTE — Telephone Encounter (Signed)
Patient informed. Copy sent to PCP °

## 2021-04-12 NOTE — Telephone Encounter (Signed)
-----   Message from Satira Sark, MD sent at 04/09/2021  3:47 PM EST ----- Results reviewed.  GXT was negative for proarrhythmia which was the main question.  Continue with current plan as outlined in recent note.

## 2021-04-12 NOTE — Telephone Encounter (Signed)
-----   Message from Satira Sark, MD sent at 04/09/2021 10:51 AM EST ----- Results reviewed.  Hemoglobin and renal function remain normal.  No change in current medications.

## 2021-04-15 ENCOUNTER — Telehealth: Payer: Self-pay | Admitting: Cardiology

## 2021-04-15 DIAGNOSIS — Z6824 Body mass index (BMI) 24.0-24.9, adult: Secondary | ICD-10-CM | POA: Diagnosis not present

## 2021-04-15 DIAGNOSIS — R109 Unspecified abdominal pain: Secondary | ICD-10-CM | POA: Diagnosis not present

## 2021-04-15 DIAGNOSIS — N390 Urinary tract infection, site not specified: Secondary | ICD-10-CM | POA: Diagnosis not present

## 2021-04-15 DIAGNOSIS — Z789 Other specified health status: Secondary | ICD-10-CM | POA: Diagnosis not present

## 2021-04-15 DIAGNOSIS — Z299 Encounter for prophylactic measures, unspecified: Secondary | ICD-10-CM | POA: Diagnosis not present

## 2021-04-15 NOTE — Telephone Encounter (Signed)
Patient was seen by PCP today for an UTI. PCP wants to put patient on Cipro. She went to the pharmacy to pick up RX was told by Pharmacy that the [place] may interfere with Flecainide. Patient was concerned that it may cause problems with her heart.

## 2021-04-15 NOTE — Telephone Encounter (Signed)
Patient informed and verbalized understanding of plan Says the PA has already changed her ABT to keflex.

## 2021-04-15 NOTE — Telephone Encounter (Signed)
Cipro may increase the QTc. I would recommend avoiding. Would recommend macrobid of keflex instead depending on the culture.

## 2021-05-04 ENCOUNTER — Ambulatory Visit (INDEPENDENT_AMBULATORY_CARE_PROVIDER_SITE_OTHER): Payer: Medicare Other

## 2021-05-04 ENCOUNTER — Encounter: Payer: Self-pay | Admitting: Internal Medicine

## 2021-05-04 ENCOUNTER — Other Ambulatory Visit: Payer: Self-pay

## 2021-05-04 ENCOUNTER — Ambulatory Visit: Payer: Medicare Other | Admitting: Internal Medicine

## 2021-05-04 VITALS — BP 138/78 | HR 60 | Ht 70.0 in | Wt 163.0 lb

## 2021-05-04 DIAGNOSIS — R55 Syncope and collapse: Secondary | ICD-10-CM

## 2021-05-04 NOTE — Patient Instructions (Addendum)
Medication Instructions:  Your physician recommends that you continue on your current medications as directed. Please refer to the Current Medication list given to you today.  *If you need a refill on your cardiac medications before your next appointment, please call your pharmacy*   Lab Work: NONE   If you have labs (blood work) drawn today and your tests are completely normal, you will receive your results only by: [place] (if you have MyChart) OR A paper copy in the mail If you have any lab test that is abnormal or we need to change your treatment, we will call you to review the results.   Testing/Procedures: [place] [place]- Long Term Monitor Instructions   Your physician has requested you wear your ZIO patch monitor_14____days.  May Remove on 05/18/21  This is a single patch monitor.  [employer] supplies one patch monitor per enrollment.  Additional stickers are not available.   Please do not apply patch if you will be having a Nuclear Stress Test, Echocardiogram, Cardiac CT, MRI, or Chest Xray during the time frame you would be wearing the monitor. The patch cannot be worn during these tests.  You cannot remove and re-apply the ZIO XT patch monitor.   Your ZIO patch monitor will be sent USPS Priority mail from [facility] directly to your home address. The monitor may also be mailed to a PO BOX if home delivery is not available.   It may take 3-5 days to receive your monitor after you have been enrolled.   Once you have received you monitor, please review enclosed instructions.  Your monitor has already been registered assigning a specific monitor serial # to you.   Applying the monitor   Shave hair from upper left chest.   Hold abrader disc by orange tab.  Rub abrader in 40 strokes over left upper chest as indicated in your monitor instructions.   Clean area with 4 enclosed alcohol pads .  Use all pads to assure are is cleaned thoroughly.  Let dry.   Apply patch as  indicated in monitor instructions.  Patch will be place under collarbone on left side of chest with arrow pointing upward.   Rub patch adhesive wings for 2 minutes.Remove white label marked "1".  Remove white label marked "2".  Rub patch adhesive wings for 2 additional minutes.   While looking in a mirror, press and release button in center of patch.  A small green light will flash 3-4 times .  This will be your only indicator the monitor has been turned on.     Do not shower for the first 24 hours.  You may shower after the first 24 hours.   Press button if you feel a symptom. You will hear a small click.  Record Date, Time and Symptom in the Patient Log Book.   When you are ready to remove patch, follow instructions on last 2 pages of Patient Log Book.  Stick patch monitor onto last page of Patient Log Book.   Place Patient Log Book in [place] box.  Use locking tab on box and tape box closed securely.  The [employer] has [employer] on it.  Please place in mailbox as soon as possible.  Your physician should have your test results approximately 7 days after the monitor has been mailed back to [facility].   Call [place] at [phone] if you have questions regarding your ZIO XT patch monitor.  Call them immediately if  you see an orange light blinking on your monitor.   If your monitor falls off in less than 4 days contact our Monitor department at [phone].  If your monitor becomes loose or falls off after 4 days call [employer] at [phone] for suggestions on securing your monitor.     Follow-Up: At [facility], you and your health needs are our priority.  As part of our continuing mission to provide you with exceptional heart care, we have created designated Provider Care Teams.  These Care Teams include your primary Cardiologist (physician) and Advanced Practice Providers (APPs -  Physician Assistants and Nurse Practitioners) who all work  together to provide you with the care you need, when you need it.  We recommend signing up for the patient portal called "MyChart".  Sign up information is provided on this After Visit Summary.  MyChart is used to connect with patients for Virtual Visits (Telemedicine).  Patients are able to view lab/test results, encounter notes, upcoming appointments, etc.  Non-urgent messages can be sent to your provider as well.   To learn more about what you can do with MyChart, go to [email or web address].    Your next appointment:    Pending Monitor Results   The format for your next appointment:   In Person  Provider:   [name], MD    Other Instructions Thank you for choosing [facility]!

## 2021-05-04 NOTE — Progress Notes (Signed)
HPI Danielle Nicholson is referred today by Dr. Domenic Polite for consideration for ILR insertion. She is a pleasant 76 yo woman with PAF who has developed post termination pauses. She has been fairly well controlled on flecainide. She notes that her episodes of syncope occur suddenly with only a few second warning. She is out just seconds. Before she passes out she feels her heart racing. She denies chest pain or sob. No edema.  Allergies  Allergen Reactions   Cardizem [Diltiazem Hcl] Hives and Itching     Current Outpatient Medications  Medication Sig Dispense Refill   acetaminophen (TYLENOL) 650 MG CR tablet Take 650 mg daily as needed by mouth for pain.     apixaban (ELIQUIS) 5 MG TABS tablet TAKE 1 TABLET BY MOUTH TWICE DAILY 60 tablet 4   flecainide (TAMBOCOR) 100 MG tablet Take 1 tablet (100 mg total) by mouth 2 (two) times daily. 180 tablet 3   metoprolol succinate (TOPROL-XL) 50 MG 24 hr tablet TAKE 1 TABLET BY MOUTH TWICE DAILY 180 tablet 1   No current facility-administered medications for this visit.     Past Medical History:  Diagnosis Date   Diverticula of colon    Paroxysmal atrial fibrillation [facility])    Sarcoidosis 1973   Thyroid nodule    Dr. Caryl Never at [facility]   Typical atrial flutter ([place])     ROS:   All systems reviewed and negative except as noted in the HPI.   Past Surgical History:  Procedure Laterality Date   ABLATION SAPHENOUS VEIN W/ RFA     BREAST BIOPSY     right   Cervical node     left bx   COLONOSCOPY  11/05/2010   Procedure: COLONOSCOPY;  Surgeon: Rogene Houston, MD;  [facility];  Service: Endoscopy;  Laterality: N/A;  9:30 am   COLONOSCOPY N/A 02/22/2017   Procedure: COLONOSCOPY;  Surgeon: Rogene Houston, MD;  [facility];  Service: Endoscopy;  Laterality: N/A;  [place]     right   POLYPECTOMY  02/22/2017   Procedure: POLYPECTOMY;  Surgeon: Rogene Houston, MD;  [facility];  Service:  Endoscopy;;  colon    TOTAL KNEE ARTHROPLASTY  11/02/2011   Procedure: TOTAL KNEE ARTHROPLASTY;  Surgeon: Gearlean Alf, MD;  [facility]: [facility];  Service: Orthopedics;  Laterality: Left;     Family History  Problem Relation Age of Onset   Colon cancer Mother      Social History   Socioeconomic History   Marital status: Married    Spouse name: Not on file   Number of children: Not on file   Years of education: Not on file   Highest education level: Not on file  Occupational History   Not on file  Tobacco Use   Smoking status: Never   Smokeless tobacco: Never  Vaping Use   Vaping Use: Never used  Substance and Sexual Activity   Alcohol use: Yes    Alcohol/week: 2.0 standard drinks    Types: 2 Glasses of wine per week    Comment: Wine- Weekends ( maybe )   Drug use: No   Sexual activity: Not on file  Other Topics Concern   Not on file  Social History Narrative   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 [place] Strain: Not on file  Food Insecurity: Not  on file  Transportation Needs: Not on file  Physical Activity: Not on file  Stress: Not on file  Social Connections: Not on file  Intimate Partner Violence: Not on file     BP 138/78    Pulse 60    Ht 5\' 10"  (1.778 m)    Wt 163 lb (73.9 kg)    SpO2 99%    BMI 23.39 kg/m   Physical Exam:  Well appearing NAD HEENT: Unremarkable Neck:  No JVD, no thyromegally Lymphatics:  No adenopathy Back:  No CVA tenderness Lungs:  Clear with no wheezes HEART:  Regular rate rhythm, no murmurs, no rubs, no clicks Abd:  soft, positive bowel sounds, no organomegally, no rebound, no guarding Ext:  2 plus pulses, no edema, no cyanosis, no clubbing Skin:  No rashes no nodules Neuro:  CN II through XII intact, motor grossly intact  EKG - reviewed. Sinus brady at 57/min   Assess/Plan:  PAF - she is mostly well controlled on flecainide. She will continue her current meds.   Syncope - I suspect that she is having post termination pauses. We will ask her to wear a zio monitor. If no pauses are documented then she will undergo ILR insertion. HTN - her bp is up a little. She will continue the beta blocker. Coags - no bleeding on eliquis. If a PPM is required we will hold for a couple of days.  Carleene Overlie Grahm Etsitty,MD

## 2021-05-05 DIAGNOSIS — I4891 Unspecified atrial fibrillation: Secondary | ICD-10-CM | POA: Diagnosis not present

## 2021-05-05 DIAGNOSIS — E7800 Pure hypercholesterolemia, unspecified: Secondary | ICD-10-CM | POA: Diagnosis not present

## 2021-05-05 DIAGNOSIS — Z789 Other specified health status: Secondary | ICD-10-CM | POA: Diagnosis not present

## 2021-05-05 DIAGNOSIS — Z299 Encounter for prophylactic measures, unspecified: Secondary | ICD-10-CM | POA: Diagnosis not present

## 2021-05-05 DIAGNOSIS — R55 Syncope and collapse: Secondary | ICD-10-CM | POA: Diagnosis not present

## 2021-05-05 DIAGNOSIS — Z6823 Body mass index (BMI) 23.0-23.9, adult: Secondary | ICD-10-CM | POA: Diagnosis not present

## 2021-05-05 DIAGNOSIS — I480 Paroxysmal atrial fibrillation: Secondary | ICD-10-CM | POA: Diagnosis not present

## 2021-05-18 DIAGNOSIS — R350 Frequency of micturition: Secondary | ICD-10-CM | POA: Diagnosis not present

## 2021-05-18 DIAGNOSIS — D6869 Other thrombophilia: Secondary | ICD-10-CM | POA: Diagnosis not present

## 2021-05-18 DIAGNOSIS — I4891 Unspecified atrial fibrillation: Secondary | ICD-10-CM | POA: Diagnosis not present

## 2021-05-18 DIAGNOSIS — N390 Urinary tract infection, site not specified: Secondary | ICD-10-CM | POA: Diagnosis not present

## 2021-05-18 DIAGNOSIS — Z299 Encounter for prophylactic measures, unspecified: Secondary | ICD-10-CM | POA: Diagnosis not present

## 2021-05-20 ENCOUNTER — Institutional Professional Consult (permissible substitution): Payer: Medicare Other | Admitting: Internal Medicine

## 2021-05-25 DIAGNOSIS — R55 Syncope and collapse: Secondary | ICD-10-CM | POA: Diagnosis not present

## 2021-06-03 ENCOUNTER — Telehealth: Payer: Self-pay | Admitting: Internal Medicine

## 2021-06-03 NOTE — Telephone Encounter (Signed)
Patient called requesting to see if her Monitor results are back. States that she wore the monitor from 1/31 to 2/14 . Please call patient on her cell phone.  ?

## 2021-06-03 NOTE — Telephone Encounter (Signed)
Evans Lance, MD  ?06/01/2021 10:07 PM EST   ?  ?Essentially normal cardiac monitor with no significant arrhythmias  ? ?

## 2021-06-03 NOTE — Telephone Encounter (Signed)
Patient verbalized understanding of results. Pt would like to know the next steps.  ?Per Dr. Tanna Furry note: ? ?Syncope - I suspect that she is having post termination pauses. We will ask her to wear a zio monitor. If no pauses are documented then she will undergo ILR insertion. ? ? ?Please advise.  ?

## 2021-06-07 ENCOUNTER — Encounter: Payer: Self-pay | Admitting: Internal Medicine

## 2021-06-07 NOTE — Telephone Encounter (Signed)
We will see her back in the office to review the ILR results. ?

## 2021-06-08 ENCOUNTER — Other Ambulatory Visit: Payer: Self-pay | Admitting: Cardiology

## 2021-06-08 ENCOUNTER — Telehealth: Payer: Self-pay

## 2021-06-08 NOTE — Telephone Encounter (Signed)
Prescription refill request for Eliquis received. ?Indication: PAF ?Last office visit: 05/04/21  Beckie Salts MD ?Scr: 0.81 on 04/09/21 ?Age: 76 ?Weight: 73.9kg ? ?Based on above findings Eliquis '5mg'$  twice daily is the appropriate dose.  Refill approved. ? ?

## 2021-06-08 NOTE — Telephone Encounter (Signed)
Call placed to Pt. ? ?Advised Dr. Lovena Le recommends proceeding with loop implant. ? ?Pt in agreement. ? ?Pt scheduled for 07/08/2021 at 11:00 am for loop implant. ?

## 2021-06-08 NOTE — Telephone Encounter (Signed)
-----   Message from Evans Lance, MD sent at 06/01/2021 10:07 PM EST ----- ?Essentially normal cardiac monitor with no significant arrhythmias ? ?

## 2021-06-08 NOTE — Telephone Encounter (Signed)
Pt will see Dr. Lovena Le on 4/20 at 1:45 pm in the [facility] ?

## 2021-06-17 ENCOUNTER — Other Ambulatory Visit: Payer: Self-pay | Admitting: Cardiology

## 2021-06-17 NOTE — Progress Notes (Signed)
? ? ?Cardiology Office Note ? ?Date: 06/18/2021  ? ?ID: Danielle Nicholson, DOB 1946/03/28, MRN [id number] ? ?PCP:  Glenda Chroman, MD  ?Cardiologist:  Rozann Lesches, MD ?Electrophysiologist:  None  ? ?Chief Complaint  ?Patient presents with  ? Cardiac follow-up  ? ? ?History of Present Illness: ?Danielle Nicholson is a 76 y.o. female last seen in January.  She is here for a routine visit.  She has not had any profound dizzy spells or syncope since last encounter.  Occasional palpitations persist. ? ?She did see Dr. Lovena Le in consultation in January and she is scheduled to undergo placement of ILR in early April to further investigate episodic syncope.  Zio patch did not demonstrate any obvious arrhythmias.  Concern is for possible posttermination pauses. ? ?We went over her medications and did not make any adjustments today. ? ?Past Medical History:  ?Diagnosis Date  ? Diverticula of colon   ? Paroxysmal atrial fibrillation ([facility])   ? Sarcoidosis 1973  ? Thyroid nodule   ? [facility] Caryl [facility] at [facility]  ? Typical atrial flutter ([place])   ? ? ?Past Surgical History:  ?Procedure Laterality Date  ? ABLATION SAPHENOUS VEIN W/ RFA    ? BREAST BIOPSY    ? right  ? Cervical node    ? left bx  ? COLONOSCOPY  11/05/2010  ? Procedure: COLONOSCOPY;  Surgeon: Rogene Houston, MD;  [facility];  Service: Endoscopy;  Laterality: N/A;  9:30 am  ? COLONOSCOPY N/A 02/22/2017  ? Procedure: COLONOSCOPY;  Surgeon: Rogene Houston, MD;  [facility];  Service: Endoscopy;  Laterality: N/A;  730  ? HERNIA REPAIR    ? right  ? POLYPECTOMY  02/22/2017  ? Procedure: POLYPECTOMY;  Surgeon: Rogene Houston, MD;  [facility];  Service: Endoscopy;;  colon ?  ? TOTAL KNEE ARTHROPLASTY  11/02/2011  ? Procedure: TOTAL KNEE ARTHROPLASTY;  Surgeon: Gearlean Alf, MD;  [facility];  Service: Orthopedics;  Laterality: Left;  ? ? ?Current Outpatient Medications  ?Medication Sig Dispense Refill  ? acetaminophen (TYLENOL) 650 MG CR  tablet Take 650 mg daily as needed by mouth for pain.    ? apixaban (ELIQUIS) 5 MG TABS tablet TAKE 1 TABLET BY MOUTH TWICE DAILY 60 tablet 11  ? flecainide (TAMBOCOR) 100 MG tablet Take 1 tablet (100 mg total) by mouth 2 (two) times daily. 180 tablet 3  ? metoprolol succinate (TOPROL-XL) 50 MG 24 hr tablet TAKE 1 TABLET BY MOUTH TWICE DAILY 180 tablet 1  ? ?No current facility-administered medications for this visit.  ? ?Allergies:  Cardizem [diltiazem hcl]  ? ?ROS: No orthopnea or PND. ? ?Physical Exam: ?VS:  BP 116/80   Pulse (!) 57   Ht '5\' 10"'$  (1.778 m)   Wt 161 lb 9.6 oz (73.3 kg)   SpO2 96%   BMI 23.19 kg/m? , BMI Body mass index is 23.19 kg/m?. ? ?Wt Readings from Last 3 Encounters:  ?06/18/21 161 lb 9.6 oz (73.3 kg)  ?05/04/21 163 lb (73.9 kg)  ?04/07/21 167 lb (75.8 kg)  ?  ?General: Patient appears comfortable at rest. ?HEENT: Conjunctiva and lids normal, wearing a mask. ?Cardiac: Regular rate and rhythm, no S3 or significant systolic murmur, no pericardial rub. ? ?ECG:  An ECG dated 04/07/2021 was personally reviewed today and demonstrated:  Sinus bradycardia. ? ?Recent Labwork: ?04/09/2021: BUN 19; Creatinine, Ser 0.81; Hemoglobin 14.2; Platelets 210; Potassium 4.3; Sodium 140  ? ?  Other Studies Reviewed Today: ? ?Echocardiogram 10/04/2017: ?- Left ventricle: The cavity size was normal. Wall thickness was  ?  increased in a pattern of mild LVH. Systolic function was normal.  ?  The estimated ejection fraction was in the range of 60% to 65%.  ?  Wall motion was normal; there were no regional wall motion  ?  abnormalities. Left ventricular diastolic function parameters  ?  were normal.  ? ?GXT 04/09/2021: ?  Fair exercise capacity, achieved 7.0 METS ?  Max heart rate 92 bpm (63% max age predicted heart rate) ?  Normal blood pressure resonse to exercise ?  No ST deviation was noted. ?  No EKG evidence of ischemia, but study is not diagnostic due to failure to achieve goal heart rate ? ?Cardiac monitor February  2023: ?1. NSR with sinus bradycardia ?2. Rare PVC's and PAC's ?3. NS SVT, brief ?4. No atrial fib or prolonged pauses ? ?Assessment and Plan: ? ?Paroxysmal atrial fibrillation as well as history of typical atrial flutter, CHA2DS2-VASc score is 3.  She continues on flecainide, Toprol-XL, and Eliquis.  She has had episodes of lightheadedness and frank syncope as discussed in the prior note.  Concern remains about possibility of posttermination pauses although her Zio patch did not demonstrate any thing specific.  Plan is for ILR per Dr. Lovena Le in early April.  We did not make any adjustments in her treatment plan today otherwise. ? ?Medication Adjustments/Labs and Tests Ordered: ?Current medicines are reviewed at length with the patient today.  Concerns regarding medicines are outlined above.  ? ?Tests Ordered: ?No orders of the defined types were placed in this encounter. ? ? ?Medication Changes: ?No orders of the defined types were placed in this encounter. ? ? ?Disposition:  Follow up  3 months. ? ?Signed, ?Satira Sark, MD, [facility] ?06/18/2021 11:44 AM    ?[place] [facility] ?[address], [place], [place] [address] ?Phone: [phone]; Fax: [phone]  ?

## 2021-06-18 ENCOUNTER — Ambulatory Visit: Payer: Medicare Other | Admitting: Cardiology

## 2021-06-18 ENCOUNTER — Encounter: Payer: Self-pay | Admitting: Cardiology

## 2021-06-18 VITALS — BP 116/80 | HR 57 | Ht 70.0 in | Wt 161.6 lb

## 2021-06-18 DIAGNOSIS — Z87898 Personal history of other specified conditions: Secondary | ICD-10-CM | POA: Diagnosis not present

## 2021-06-18 DIAGNOSIS — I480 Paroxysmal atrial fibrillation: Secondary | ICD-10-CM | POA: Diagnosis not present

## 2021-06-18 NOTE — Patient Instructions (Addendum)
Medication Instructions:   Your physician recommends that you continue on your current medications as directed. Please refer to the Current Medication list given to you today.  Labwork:  none  Testing/Procedures:  none  Follow-Up:  Your physician recommends that you schedule a follow-up appointment in: 3 months.  Any Other Special Instructions Will Be Listed Below (If Applicable).  If you need a refill on your cardiac medications before your next appointment, please call your pharmacy. 

## 2021-07-06 ENCOUNTER — Ambulatory Visit: Payer: Medicare Other | Admitting: Cardiology

## 2021-07-08 ENCOUNTER — Encounter: Payer: Self-pay | Admitting: Internal Medicine

## 2021-07-08 ENCOUNTER — Ambulatory Visit: Payer: Medicare Other | Admitting: Internal Medicine

## 2021-07-08 DIAGNOSIS — I480 Paroxysmal atrial fibrillation: Secondary | ICD-10-CM

## 2021-07-08 DIAGNOSIS — I4891 Unspecified atrial fibrillation: Secondary | ICD-10-CM | POA: Insufficient documentation

## 2021-07-08 DIAGNOSIS — R55 Syncope and collapse: Secondary | ICD-10-CM | POA: Diagnosis not present

## 2021-07-08 NOTE — Patient Instructions (Addendum)
Medication Instructions:  ?Your physician recommends that you continue on your current medications as directed. Please refer to the Current Medication list given to you today. ? ?Labwork: ?None ordered. ? ?Testing/Procedures: ?None ordered. ? ?Follow-Up: ? ?Your physician wants you to follow-up in: as needed with Dr. Lovena Le.   ? ?Implantable Loop Recorder Placement, Care After ?This sheet gives you information about how to care for yourself after your procedure. Your health care provider may also give you more specific instructions. If you have problems or questions, contact your health care provider. ?What can I expect after the procedure? ?After the procedure, it is common to have: ?Soreness or discomfort near the incision. ?Some swelling or bruising near the incision. ? ?Follow these instructions at home: ?Incision care ? ? Leave your outer dressing on for 72 hours.  After 72 hours you can remove your outer dressing and shower. ?Leave adhesive strips in place. These skin closures may need to stay in place for 1-2 weeks. If adhesive strip edges start to loosen and curl up, you may trim the loose edges.  You may remove the strips if they have not fallen off after 2 weeks. ?Check your incision area every day for signs of infection. Check for: ?Redness, swelling, or pain. ?Fluid or blood. ?Warmth. ?Pus or a bad smell. ?Do not take baths, swim, or use a hot tub until your incision is completely healed. ?If your wound site starts to bleed apply pressure.   ?   ?If you have any questions/concerns please call the device clinic at [phone]. ? ?Activity ? ?Return to your normal activities. ? ?General instructions ?Follow instructions from your health care provider about how to manage your implantable loop recorder and transmit the information. Learn how to activate a recording if this is necessary for your type of device. ?You may go through a metal detection gate, and you may let someone hold a metal detector over  your chest. Show your ID card if needed. ?Do not have an MRI unless you check with your health care provider first. ?Take over-the-counter and prescription medicines only as told by your health care provider. ?Keep all follow-up visits as told by your health care provider. This is important. ?Contact a health care provider if: ?You have redness, swelling, or pain around your incision. ?You have a fever. ?You have pain that is not relieved by your pain medicine. ?You have triggered your device because of fainting (syncope) or because of a heartbeat that feels like it is racing, slow, fluttering, or skipping (palpitations). ?Get help right away if you have: ?Chest pain. ?Difficulty breathing. ?Summary ?After the procedure, it is common to have soreness or discomfort near the incision. ?Change your dressing as told by your health care provider. ?Follow instructions from your health care provider about how to manage your implantable loop recorder and transmit the information. ?Keep all follow-up visits as told by your health care provider. This is important. ?This information is not intended to replace advice given to you by your health care provider. Make sure you discuss any questions you have with your health care provider. ?Document Released: 03/02/2015 Document Revised: 05/06/2017 Document Reviewed: 05/06/2017 ?Elsevier Patient Education ? [place]. ? ?

## 2021-07-08 NOTE — Progress Notes (Addendum)
? ? ? ? ?HPI ?Danielle Nicholson returns today for followup of unexplained syncope. She is a pleasant 76 yo woman with a h/o PAF who has known post termination pauses without syncope. She wore a cardiac monitor which showed no pauses. She presents today for ILR insertion. The patient has not had more syncope since I saw her 2 months ago.  ?Allergies  ?Allergen Reactions  ? Cardizem [Diltiazem Hcl] Hives and Itching  ? ? ? ?Current Outpatient Medications  ?Medication Sig Dispense Refill  ? acetaminophen (TYLENOL) 650 MG CR tablet Take 650 mg daily as needed by mouth for pain.    ? apixaban (ELIQUIS) 5 MG TABS tablet TAKE 1 TABLET BY MOUTH TWICE DAILY 60 tablet 11  ? flecainide (TAMBOCOR) 100 MG tablet Take 1 tablet (100 mg total) by mouth 2 (two) times daily. 180 tablet 3  ? metoprolol succinate (TOPROL-XL) 50 MG 24 hr tablet TAKE 1 TABLET BY MOUTH TWICE DAILY 180 tablet 1  ? ?No current facility-administered medications for this visit.  ? ? ? ?Past Medical History:  ?Diagnosis Date  ? Diverticula of colon   ? Paroxysmal atrial fibrillation (HCC)   ? Sarcoidosis 1973  ? Thyroid nodule   ? Dr. Caryl Never at [facility]  ? Typical atrial flutter ([place])   ? ? ?ROS: ? ? All systems reviewed and negative except as noted in the HPI. ? ? ?Past Surgical History:  ?Procedure Laterality Date  ? ABLATION SAPHENOUS VEIN W/ RFA    ? BREAST BIOPSY    ? right  ? Cervical node    ? left bx  ? COLONOSCOPY  11/05/2010  ? Procedure: COLONOSCOPY;  Surgeon: Rogene Houston, MD;  [facility];  Service: Endoscopy;  Laterality: N/A;  9:30 am  ? COLONOSCOPY N/A 02/22/2017  ? Procedure: COLONOSCOPY;  Surgeon: Rogene Houston, MD;  [facility];  Service: Endoscopy;  Laterality: N/A;  730  ? HERNIA REPAIR    ? right  ? POLYPECTOMY  02/22/2017  ? Procedure: POLYPECTOMY;  Surgeon: Rogene Houston, MD;  [facility];  Service: Endoscopy;;  colon ?  ? TOTAL KNEE ARTHROPLASTY  11/02/2011  ? Procedure: TOTAL KNEE ARTHROPLASTY;   Surgeon: Gearlean Alf, MD;  [facility];  Service: Orthopedics;  Laterality: Left;  ? ? ? ?Family History  ?Problem Relation Age of Onset  ? Colon cancer Mother   ? ? ? ?Social History  ? ?Socioeconomic History  ? Marital status: Married  ?  Spouse name: Not on file  ? Number of children: Not on file  ? Years of education: Not on file  ? Highest education level: Not on file  ?Occupational History  ? Not on file  ?Tobacco Use  ? Smoking status: Never  ? Smokeless tobacco: Never  ?Vaping Use  ? Vaping Use: Never used  ?Substance and Sexual Activity  ? Alcohol use: Yes  ?  Alcohol/week: 2.0 standard drinks  ?  Types: 2 Glasses of wine per week  ?  Comment: Wine- Weekends ( maybe )  ? Drug use: No  ? Sexual activity: Not on file  ?Other Topics Concern  ? Not on file  ?Social History Narrative  ? Retired Immunologist at [employer]  ? Lives in [place]  ? ?Social Determinants of Health  ? ?Financial Resource Strain: Not on file  ?Food Insecurity: Not on file  ?Transportation Needs: Not on file  ?Physical Activity: Not on file  ?Stress:  Not on file  ?Social Connections: Not on file  ?Intimate Partner Violence: Not on file  ? ? ? ?BP 124/72   Pulse 60   Ht '5\' 10"'$  (1.778 m)   Wt 163 lb 6.4 oz (74.1 kg)   SpO2 97%   BMI 23.45 kg/m?  ? ?Physical Exam: ? ?Well appearing 76 yo woman, NAD ?HEENT: Unremarkable ?Neck:  No JVD, no thyromegally ?Lymphatics:  No adenopathy ?Back:  No CVA tenderness ?Lungs:  Clear with no wheezes ?HEART:  Regular rate rhythm, no murmurs, no rubs, no clicks ?Abd:  soft, positive bowel sounds, no organomegally, no rebound, no guarding ?Ext:  2 plus pulses, no edema, no cyanosis, no clubbing ?Skin:  No rashes no nodules ?Neuro:  CN II through XII intact, motor grossly intact ? ? ?ILR insertion ? ?Preoperative diagnosis: unexplained syncope ? ?Postopertative diagnosis: same as preoperative diagnosis ? ?Procedure Performed: ILR insertion ? ?Description of the procedure: After informed consent was  obtained the patient was prepped and draped in a sterile fashion. 10 cc of subcutaneous lidocaine was infiltrated into the left pectoral region. A 1 cm incision was carried out. The medtronic ILR, [id number] was inserted. The R waves measured 0.6 mV. Benzoin and steristrips were painted on the skin, and a bandage applied and the patient was recovered in the usual manner.  ? ?Complications: none immediately ? ?Assess/Plan:  ?Unexplained syncope - she is s/p ILR insertion. She will undergo watchful waiting.  ?PAF - she is controlled on medical therapy. We will monitor her atrial fib with the ILR ?HTN - her bp is well controlled.. We will follow. ?Systemic anti-coag - she will continue her eliquis. ? ?Carleene Overlie Sanam Marmo,MD ?

## 2021-07-20 ENCOUNTER — Ambulatory Visit: Payer: Medicare Other | Admitting: Cardiology

## 2021-07-22 ENCOUNTER — Ambulatory Visit: Payer: Medicare Other | Admitting: Internal Medicine

## 2021-08-02 ENCOUNTER — Telehealth: Payer: Self-pay

## 2021-08-02 MED ORDER — METOPROLOL SUCCINATE ER 25 MG PO TB24: 12.5000 mg | ORAL_TABLET | Freq: Every day | ORAL | 0 refills | Status: DC

## 2021-08-02 NOTE — Telephone Encounter (Signed)
Will review with Dr. Curt Bears when back in office this pm.  ? ?Patient reports yesterday she was riding in the car and felt like she was going to pass out. Laid her head back and did not not have complete loc. Will call patient once reviewed. Patient aware and updated to plan. ?

## 2021-08-02 NOTE — Addendum Note (Signed)
Addended by: Roma Kayser T on: 08/02/2021 04:24 PM ? ? Modules accepted: Orders ? ?

## 2021-08-02 NOTE — Telephone Encounter (Signed)
Pt called stating she had an episode. I told her the nurse will review her transmission and give her a call back. ?

## 2021-08-02 NOTE — Telephone Encounter (Signed)
Spoke with Dr. Curt Bears, verbal order obtained to decrease Toprol-XL to 12.5 daily (dose confirmed twice). If patient has another syncopal event she needs to stop toprol and flecainide all together. Dr. Curt Bears would like pt to see Dr. Lovena Le in office 08/09/2021 (Dr. Forde Dandy next day back).  ? ?Patient called and updated to plan. Patient aware to call if she has any further events. Also advised not to drive until she is seen by Dr. Lovena Le. Advised if she feels the symptoms or sensation to lower herself to the ground as much as possible to prevent falling. Patient states she did that last time and she thinks it prevented her from passing out. Patient advised of ED precaution and call with any further questions or concerns. Voiced understanding. ?

## 2021-08-02 NOTE — Telephone Encounter (Signed)
Alert ILR summary report received. Battery status OK. Normal device function. No new symptom, tachy, brady, episodes.  ?6 new AF episodes, longest duration 1hr 80mn, not always good rate control, Eliquis, Metoprolol. 2 pause events 4/30 @ 07:47, 3sec in duration and 11:06, 6sec in duration. ?Route to triage. ? ? ? ? ? ?

## 2021-08-02 NOTE — Telephone Encounter (Signed)
See 08-02-2021 note ?

## 2021-08-09 ENCOUNTER — Encounter: Payer: Self-pay | Admitting: Internal Medicine

## 2021-08-09 ENCOUNTER — Ambulatory Visit: Payer: Medicare Other | Admitting: Internal Medicine

## 2021-08-09 VITALS — BP 124/66 | HR 65 | Ht 70.0 in | Wt 163.2 lb

## 2021-08-09 DIAGNOSIS — R55 Syncope and collapse: Secondary | ICD-10-CM | POA: Diagnosis not present

## 2021-08-09 DIAGNOSIS — I480 Paroxysmal atrial fibrillation: Secondary | ICD-10-CM

## 2021-08-09 NOTE — Patient Instructions (Addendum)
Medication Instructions:  ?Your physician recommends that you continue on your current medications as directed. Please refer to the Current Medication list given to you today. ? ?Labwork: ?You will get lab work today:  CBC and BMP ? ?Testing/Procedures: ?None ordered. ? ?Follow-Up: ? ?SEE INSTRUCTION LETTER ? ?Any Other Special Instructions Will Be Listed Below (If Applicable). ? ?If you need a refill on your cardiac medications before your next appointment, please call your pharmacy.  ? ?Important Information About Sugar ? ? ? ? ? ?Pacemaker Implantation, Adult ?Pacemaker implantation is a procedure to place a pacemaker inside the chest. A pacemaker is a small computer that sends electrical signals to the heart and helps the heart beat normally. A pacemaker also stores information about heart rhythms. You may need pacemaker implantation if you have: ?A slow heartbeat (bradycardia). ?Loss of consciousness that happens repeatedly (syncope) or repeated episodes of dizziness or light-headedness because of an irregular heart rate. ?Shortness of breath (dyspnea) due to heart problems. ?The pacemaker usually attaches to your heart through a wire called a lead. One or two leads may be needed. There are different types of pacemakers: ?Transvenous pacemaker. This type is placed under the skin or muscle of your upper chest area. The lead goes through a vein in the chest area to reach the inside of the heart. ?Epicardial pacemaker. This type is placed under the skin or muscle of your chest or abdomen. The lead goes through your chest to the outside of the heart. ?Tell a health care provider about: ?Any allergies you have. ?All medicines you are taking, including vitamins, herbs, eye drops, creams, and over-the-counter medicines. ?Any problems you or family members have had with anesthetic medicines. ?Any blood or bone disorders you have. ?Any surgeries you have had. ?Any medical conditions you have. ?Whether you are pregnant  or may be pregnant. ?What are the risks? ?Generally, this is a safe procedure. However, problems may occur, including: ?Infection. ?Bleeding. ?Failure of the pacemaker or the lead. ?Collapse of a lung or bleeding into a lung. ?Blood clot inside a blood vessel with a lead. ?Damage to the heart. ?Infection inside the heart (endocarditis). ?Allergic reactions to medicines. ?What happens before the procedure? ?Staying hydrated ?Follow instructions from your health care provider about hydration, which may include: ?Up to 2 hours before the procedure - you may continue to drink clear liquids, such as water, clear fruit juice, black coffee, and plain tea. ? ?Eating and drinking restrictions ?Follow instructions from your health care provider about eating and drinking, which may include: ?8 hours before the procedure - stop eating heavy meals or foods, such as meat, fried foods, or fatty foods. ?6 hours before the procedure - stop eating light meals or foods, such as toast or cereal. ?6 hours before the procedure - stop drinking milk or drinks that contain milk. ?2 hours before the procedure - stop drinking clear liquids. ?Medicines ?Ask your health care provider about: ?Changing or stopping your regular medicines. This is especially important if you are taking diabetes medicines or blood thinners. ?Taking medicines such as aspirin and ibuprofen. These medicines can thin your blood. Do not take these medicines unless your health care provider tells you to take them. ?Taking over-the-counter medicines, vitamins, herbs, and supplements. ?Tests ?You may have: ?A heart evaluation. This may include: ?An electrocardiogram (ECG). This involves placing patches on your skin to check your heart rhythm. ?A chest X-ray. ?An echocardiogram. This is a test that uses sound waves (ultrasound) to produce  an image of the heart. ?A cardiac rhythm monitor. This is used to record your heart rhythm and any events for a longer period of  time. ?Blood tests. ?Genetic testing. ?General instructions ?Do not use any products that contain nicotine or tobacco for at least 4 weeks before the procedure. These products include cigarettes, e-cigarettes, and chewing tobacco. If you need help quitting, ask your health care provider. ?Ask your health care provider: ?How your surgery site will be marked. ?What steps will be taken to help prevent infection. These steps may include: ?Removing hair at the surgery site. ?Washing skin with a germ-killing soap. ?Receiving antibiotic medicine. ?Plan to have someone take you home from the hospital or clinic. ?If you will be going home right after the procedure, plan to have someone with you for 24 hours. ?What happens during the procedure? ?An IV will be inserted into one of your veins. ?You will be given one or more of the following: ?A medicine to help you relax (sedative). ?A medicine to numb the area (local anesthetic). ?A medicine to make you fall asleep (general anesthetic). ?The next steps vary depending on the type of pacemaker you will be getting. ?If you are getting a transvenous pacemaker: ?An incision will be made in your upper chest. ?A pocket will be made for the pacemaker. It may be placed under the skin or between layers of muscle. ?The lead will be inserted into a blood vessel that goes to the heart. ?While X-rays are taken by an imaging machine (fluoroscopy), the lead will be advanced through the vein to the inside of your heart. ?The other end of the lead will be tunneled under the skin and attached to the pacemaker. ?If you are getting an epicardial pacemaker: ?An incision will be made near your ribs or breastbone (sternum) for the lead. ?The lead will be attached to the outside of your heart. ?Another incision will be made in your chest or upper abdomen to create a pocket for the pacemaker. ?The free end of the lead will be tunneled under the skin and attached to the pacemaker. ?The transvenous or  epicardial pacemaker will be tested. Imaging studies may be done to check the lead position. ?The incisions will be closed with stitches (sutures), adhesive strips, or skin glue. ?Bandages (dressings) will be placed over the incisions. ?The procedure may vary among health care providers and hospitals. ?What happens after the procedure? ?Your blood pressure, heart rate, breathing rate, and blood oxygen level will be monitored until you leave the hospital or clinic. ?You may be given antibiotics. ?You will be given pain medicine. ?An ECG and chest X-rays will be done. ?You may need to wear a continuous type of ECG (Holter monitor) to check your heart rhythm. ?Your health care provider will program the pacemaker. ?If you were given a sedative during the procedure, it can affect you for several hours. Do not drive or operate machinery until your health care provider says that it is safe. ?You will be given a pacemaker identification card. This card lists the implant date, device model, and manufacturer of your pacemaker. ?Summary ?A pacemaker is a small computer that sends electrical signals to the heart and helps the heart beat normally. ?There are different types of pacemakers. A pacemaker may be placed under the skin or muscle of your chest or abdomen. ?Follow instructions from your health care provider about eating and drinking and about taking medicines before the procedure. ?This information is not intended to replace advice  given to you by your health care provider. Make sure you discuss any questions you have with your health care provider. ?Document Revised: 12/01/2020 Document Reviewed: 02/20/2019 ?Elsevier Patient Education ? [place]. ? ? ?

## 2021-08-09 NOTE — H&P (View-Only) (Signed)
? ? ? ? ?HPI ?[name] returns today for followup of unexplained syncope. She is a pleasant 76 yo woman with a h/o PAF who has known post termination pauses without syncope. She wore a cardiac monitor which showed no pauses. The patient has had more syncope since I saw her 1 month ago. At that time we placed an ILR and she has had post termination pauses of up to 6 seconds with her atrial fib back to NSR.  ?Allergies  ?Allergen Reactions  ? Cardizem [Diltiazem Hcl] Hives and Itching  ? ? ? ?Current Outpatient Medications  ?Medication Sig Dispense Refill  ? acetaminophen (TYLENOL) 650 MG CR tablet Take 650 mg daily as needed by mouth for pain.    ? apixaban (ELIQUIS) 5 MG TABS tablet TAKE 1 TABLET BY MOUTH TWICE DAILY 60 tablet 11  ? flecainide (TAMBOCOR) 100 MG tablet Take 1 tablet (100 mg total) by mouth 2 (two) times daily. 180 tablet 3  ? metoprolol succinate (TOPROL XL) 25 MG 24 hr tablet Take 0.5 tablets (12.5 mg total) by mouth daily. 15 tablet 0  ? ?No current facility-administered medications for this visit.  ? ? ? ?Past Medical History:  ?Diagnosis Date  ? Diverticula of colon   ? Paroxysmal atrial fibrillation (HCC)   ? Sarcoidosis 1973  ? Thyroid nodule   ? Dr. Caryl Never at [facility]  ? Typical atrial flutter ([place])   ? ? ?ROS: ? ? All systems reviewed and negative except as noted in the HPI. ? ? ?Past Surgical History:  ?Procedure Laterality Date  ? ABLATION SAPHENOUS VEIN W/ RFA    ? BREAST BIOPSY    ? right  ? Cervical node    ? left bx  ? COLONOSCOPY  11/05/2010  ? Procedure: COLONOSCOPY;  Surgeon: Rogene Houston, MD;  [facility];  Service: Endoscopy;  Laterality: N/A;  9:30 am  ? COLONOSCOPY N/A 02/22/2017  ? Procedure: COLONOSCOPY;  Surgeon: Rogene Houston, MD;  [facility];  Service: Endoscopy;  Laterality: N/A;  730  ? HERNIA REPAIR    ? right  ? POLYPECTOMY  02/22/2017  ? Procedure: POLYPECTOMY;  Surgeon: Rogene Houston, MD;  [facility];  Service: Endoscopy;;   colon ?  ? TOTAL KNEE ARTHROPLASTY  11/02/2011  ? Procedure: TOTAL KNEE ARTHROPLASTY;  Surgeon: Gearlean Alf, MD;  [facility];  Service: Orthopedics;  Laterality: Left;  ? ? ? ?Family History  ?Problem Relation Age of Onset  ? Colon cancer Mother   ? ? ? ?Social History  ? ?Socioeconomic History  ? Marital status: Married  ?  Spouse name: Not on file  ? Number of children: Not on file  ? Years of education: Not on file  ? Highest education level: Not on file  ?Occupational History  ? Not on file  ?Tobacco Use  ? Smoking status: Never  ? Smokeless tobacco: Never  ?Vaping Use  ? Vaping Use: Never used  ?Substance and Sexual Activity  ? Alcohol use: Yes  ?  Alcohol/week: 2.0 standard drinks  ?  Types: 2 Glasses of wine per week  ?  Comment: Wine- Weekends ( maybe )  ? Drug use: No  ? Sexual activity: Not on file  ?Other Topics Concern  ? Not on file  ?Social History Narrative  ? Retired Immunologist at [employer]  ? Lives in [place]  ? ?Social Determinants of Health  ? ?Financial Resource Strain: Not  on file  ?Food Insecurity: Not on file  ?Transportation Needs: Not on file  ?Physical Activity: Not on file  ?Stress: Not on file  ?Social Connections: Not on file  ?Intimate Partner Violence: Not on file  ? ? ? ?BP 124/66   Pulse 65   Ht '5\' 10"'$  (1.778 m)   Wt 163 lb 3.2 oz (74 kg)   SpO2 95%   BMI 23.42 kg/m?  ? ?Physical Exam: ? ?Well appearing NAD ?HEENT: Unremarkable ?Neck:  No JVD, no thyromegally ?Lymphatics:  No adenopathy ?Back:  No CVA tenderness ?Lungs:  Clear with no wheezes ?HEART:  Regular rate rhythm, no murmurs, no rubs, no clicks ?Abd:  soft, positive bowel sounds, no organomegally, no rebound, no guarding ?Ext:  2 plus pulses, no edema, no cyanosis, no clubbing ?Skin:  No rashes no nodules ?Neuro:  CN II through XII intact, motor grossly intact ? ?EKG -  nsr ? ?DEVICE  ?Normal device function.  See PaceArt for details. Her ILR shows up to 6 second pauses ? ?Assess/Plan:  ?Symptomatic tachy-brady  syndrome - she has had 6 second pauses and I recommended proceeding with PPM insertion. I reviewed the risks/benefits/goals/expectations and she wishes to proceed. ?Coags - she will hold her eliquis for 2 days before PPM insertion.  ?HTN - her bp is controlled. We will follow. ?PAF - we will see how she does with her symptoms. I'll continue flecainide for now. ? ?Carleene Overlie Kaithlyn Teagle,MD ?

## 2021-08-09 NOTE — Progress Notes (Signed)
? ? ? ? ?HPI ?Danielle Nicholson returns today for followup of unexplained syncope. She is a pleasant 76 yo woman with a h/o PAF who has known post termination pauses without syncope. She wore a cardiac monitor which showed no pauses. The patient has had more syncope since I saw her 1 month ago. At that time we placed an ILR and she has had post termination pauses of up to 6 seconds with her atrial fib back to NSR.  ?Allergies  ?Allergen Reactions  ? Cardizem [Diltiazem Hcl] Hives and Itching  ? ? ? ?Current Outpatient Medications  ?Medication Sig Dispense Refill  ? acetaminophen (TYLENOL) 650 MG CR tablet Take 650 mg daily as needed by mouth for pain.    ? apixaban (ELIQUIS) 5 MG TABS tablet TAKE 1 TABLET BY MOUTH TWICE DAILY 60 tablet 11  ? flecainide (TAMBOCOR) 100 MG tablet Take 1 tablet (100 mg total) by mouth 2 (two) times daily. 180 tablet 3  ? metoprolol succinate (TOPROL XL) 25 MG 24 hr tablet Take 0.5 tablets (12.5 mg total) by mouth daily. 15 tablet 0  ? ?No current facility-administered medications for this visit.  ? ? ? ?Past Medical History:  ?Diagnosis Date  ? Diverticula of colon   ? Paroxysmal atrial fibrillation (HCC)   ? Sarcoidosis 1973  ? Thyroid nodule   ? Dr. Caryl [facility] at [facility]  ? Typical atrial flutter ([place])   ? ? ?ROS: ? ? All systems reviewed and negative except as noted in the HPI. ? ? ?Past Surgical History:  ?Procedure Laterality Date  ? ABLATION SAPHENOUS VEIN W/ RFA    ? BREAST BIOPSY    ? right  ? Cervical node    ? left bx  ? COLONOSCOPY  11/05/2010  ? Procedure: COLONOSCOPY;  Surgeon: Rogene Houston, MD;  [facility];  Service: Endoscopy;  Laterality: N/A;  9:30 am  ? COLONOSCOPY N/A 02/22/2017  ? Procedure: COLONOSCOPY;  Surgeon: Rogene Houston, MD;  [facility];  Service: Endoscopy;  Laterality: N/A;  730  ? HERNIA REPAIR    ? right  ? POLYPECTOMY  02/22/2017  ? Procedure: POLYPECTOMY;  Surgeon: Rogene Houston, MD;  [facility];  Service: Endoscopy;;   colon ?  ? TOTAL KNEE ARTHROPLASTY  11/02/2011  ? Procedure: TOTAL KNEE ARTHROPLASTY;  Surgeon: Gearlean Alf, MD;  [facility];  Service: Orthopedics;  Laterality: Left;  ? ? ? ?Family History  ?Problem Relation Age of Onset  ? Colon cancer Mother   ? ? ? ?Social History  ? ?Socioeconomic History  ? Marital status: Married  ?  Spouse name: Not on file  ? Number of children: Not on file  ? Years of education: Not on file  ? Highest education level: Not on file  ?Occupational History  ? Not on file  ?Tobacco Use  ? Smoking status: Never  ? Smokeless tobacco: Never  ?Vaping Use  ? Vaping Use: Never used  ?Substance and Sexual Activity  ? Alcohol use: Yes  ?  Alcohol/week: 2.0 standard drinks  ?  Types: 2 Glasses of wine per week  ?  Comment: Wine- Weekends ( maybe )  ? Drug use: No  ? Sexual activity: Not on file  ?Other Topics Concern  ? Not on file  ?Social History Narrative  ? 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 ?Social Determinants of Health  ? ?Financial Resource Strain: Not  on file  ?Food Insecurity: Not on file  ?Transportation Needs: Not on file  ?Physical Activity: Not on file  ?Stress: Not on file  ?Social Connections: Not on file  ?Intimate Partner Violence: Not on file  ? ? ? ?BP 124/66   Pulse 65   Ht '5\' 10"'$  (1.778 m)   Wt 163 lb 3.2 oz (74 kg)   SpO2 95%   BMI 23.42 kg/m?  ? ?Physical Exam: ? ?Well appearing NAD ?HEENT: Unremarkable ?Neck:  No JVD, no thyromegally ?Lymphatics:  No adenopathy ?Back:  No CVA tenderness ?Lungs:  Clear with no wheezes ?HEART:  Regular rate rhythm, no murmurs, no rubs, no clicks ?Abd:  soft, positive bowel sounds, no organomegally, no rebound, no guarding ?Ext:  2 plus pulses, no edema, no cyanosis, no clubbing ?Skin:  No rashes no nodules ?Neuro:  CN II through XII intact, motor grossly intact ? ?EKG -  nsr ? ?DEVICE  ?Normal device function.  See PaceArt for details. Her ILR shows up to 6 second pauses ? ?Assess/Plan:  ?Symptomatic tachy-brady  syndrome - she has had 6 second pauses and I recommended proceeding with PPM insertion. I reviewed the risks/benefits/goals/expectations and she wishes to proceed. ?Coags - she will hold her eliquis for 2 days before PPM insertion.  ?HTN - her bp is controlled. We will follow. ?PAF - we will see how she does with her symptoms. I'll continue flecainide for now. ? ?Danielle Overlie Brandis Wixted,MD ?

## 2021-08-10 DIAGNOSIS — Z6823 Body mass index (BMI) 23.0-23.9, adult: Secondary | ICD-10-CM | POA: Diagnosis not present

## 2021-08-10 DIAGNOSIS — I480 Paroxysmal atrial fibrillation: Secondary | ICD-10-CM | POA: Diagnosis not present

## 2021-08-10 DIAGNOSIS — D692 Other nonthrombocytopenic purpura: Secondary | ICD-10-CM | POA: Diagnosis not present

## 2021-08-10 DIAGNOSIS — Z299 Encounter for prophylactic measures, unspecified: Secondary | ICD-10-CM | POA: Diagnosis not present

## 2021-08-10 DIAGNOSIS — Z7189 Other specified counseling: Secondary | ICD-10-CM | POA: Diagnosis not present

## 2021-08-10 DIAGNOSIS — Z0000 Encounter for general adult medical examination without abnormal findings: Secondary | ICD-10-CM | POA: Diagnosis not present

## 2021-08-10 LAB — CBC WITH DIFFERENTIAL/PLATELET
Basophils Absolute: 0 10*3/uL (ref 0.0–0.2)
Basos: 0 %
EOS (ABSOLUTE): 0.1 10*3/uL (ref 0.0–0.4)
Eos: 2 %
Hematocrit: 42.4 % (ref 34.0–46.6)
Hemoglobin: 14.1 g/dL (ref 11.1–15.9)
Immature Grans (Abs): 0 10*3/uL (ref 0.0–0.1)
Immature Granulocytes: 0 %
Lymphocytes Absolute: 1.6 10*3/uL (ref 0.7–3.1)
Lymphs: 26 %
MCH: 30.8 pg (ref 26.6–33.0)
MCHC: 33.3 g/dL (ref 31.5–35.7)
MCV: 93 fL (ref 79–97)
Monocytes Absolute: 0.7 10*3/uL (ref 0.1–0.9)
Monocytes: 12 %
Neutrophils Absolute: 3.7 10*3/uL (ref 1.4–7.0)
Neutrophils: 60 %
Platelets: 239 10*3/uL (ref 150–450)
RBC: 4.58 x10E6/uL (ref 3.77–5.28)
RDW: 11.9 % (ref 11.7–15.4)
WBC: 6.2 10*3/uL (ref 3.4–10.8)

## 2021-08-10 LAB — BASIC METABOLIC PANEL
BUN/Creatinine Ratio: 35 — ABNORMAL HIGH (ref 12–28)
BUN: 28 mg/dL — ABNORMAL HIGH (ref 8–27)
CO2: 24 mmol/L (ref 20–29)
Calcium: 10.1 mg/dL (ref 8.7–10.3)
Chloride: 101 mmol/L (ref 96–106)
Creatinine, Ser: 0.79 mg/dL (ref 0.57–1.00)
Glucose: 97 mg/dL (ref 70–99)
Potassium: 5.4 mmol/L — ABNORMAL HIGH (ref 3.5–5.2)
Sodium: 140 mmol/L (ref 134–144)
eGFR: 77 mL/min/{1.73_m2} (ref >59)

## 2021-08-10 LAB — CUP PACEART REMOTE DEVICE CHECK
Date Time Interrogation Session: 20230509125947
Implantable Pulse Generator Implant Date: 20230406

## 2021-08-10 NOTE — Pre-Procedure Instructions (Addendum)
Instructed patient on the following items: ?Arrival time 0930 on Thursday 5/11 ?Nothing to eat or drink after midnight ?No meds AM of procedure ?Responsible person to drive you home and stay with you for 24 hrs ?Wash with special soap night before and morning of procedure ?If on anti-coagulant drug instructions Eliquis- last dose 5/8  ?

## 2021-08-12 ENCOUNTER — Encounter (HOSPITAL_COMMUNITY)
Admission: RE | Disposition: A | Discharge status: Home / Self Care | Payer: Medicare Other | Source: Home / Self Care | Attending: Internal Medicine

## 2021-08-12 ENCOUNTER — Ambulatory Visit (HOSPITAL_COMMUNITY)
Admission: RE | Admit: 2021-08-12 | Discharge: 2021-08-12 | Disposition: A | Discharge status: Home / Self Care | Payer: Medicare Other | Attending: Internal Medicine | Admitting: Internal Medicine

## 2021-08-12 ENCOUNTER — Other Ambulatory Visit: Payer: Self-pay

## 2021-08-12 ENCOUNTER — Ambulatory Visit (HOSPITAL_COMMUNITY): Payer: Medicare Other

## 2021-08-12 DIAGNOSIS — I10 Essential (primary) hypertension: Secondary | ICD-10-CM | POA: Insufficient documentation

## 2021-08-12 DIAGNOSIS — Z7901 Long term (current) use of anticoagulants: Secondary | ICD-10-CM | POA: Diagnosis not present

## 2021-08-12 DIAGNOSIS — R001 Bradycardia, unspecified: Secondary | ICD-10-CM | POA: Diagnosis not present

## 2021-08-12 DIAGNOSIS — Z79899 Other long term (current) drug therapy: Secondary | ICD-10-CM | POA: Insufficient documentation

## 2021-08-12 DIAGNOSIS — I495 Sick sinus syndrome: Secondary | ICD-10-CM | POA: Insufficient documentation

## 2021-08-12 DIAGNOSIS — I480 Paroxysmal atrial fibrillation: Secondary | ICD-10-CM | POA: Diagnosis not present

## 2021-08-12 DIAGNOSIS — Z950 Presence of cardiac pacemaker: Secondary | ICD-10-CM | POA: Diagnosis not present

## 2021-08-12 HISTORY — PX: LOOP RECORDER REMOVAL: EP1215

## 2021-08-12 HISTORY — PX: PACEMAKER IMPLANT: EP1218

## 2021-08-12 SURGERY — PACEMAKER IMPLANT

## 2021-08-12 MED ORDER — HEPARIN (PORCINE) IN NACL 1000-0.9 UT/500ML-% IV SOLN
INTRAVENOUS | Status: AC
  Filled 2021-08-12: qty 500

## 2021-08-12 MED ORDER — CEFAZOLIN SODIUM-DEXTROSE 1-4 GM/50ML-% IV SOLN
1.0000 g | Freq: Once | INTRAVENOUS | Status: AC
  Administered 2021-08-12: 1 g via INTRAVENOUS
  Filled 2021-08-12 (×2): qty 50

## 2021-08-12 MED ORDER — LIDOCAINE HCL (PF) 1 % IJ SOLN
INTRAMUSCULAR | Status: AC
  Filled 2021-08-12: qty 30

## 2021-08-12 MED ORDER — SODIUM CHLORIDE 0.9 % IV SOLN: INTRAVENOUS | Status: DC

## 2021-08-12 MED ORDER — MIDAZOLAM HCL 5 MG/5ML IJ SOLN
INTRAMUSCULAR | Status: AC
  Filled 2021-08-12: qty 5

## 2021-08-12 MED ORDER — FENTANYL CITRATE (PF) 100 MCG/2ML IJ SOLN
INTRAMUSCULAR | Status: DC | PRN
  Administered 2021-08-12: 12.5 ug via INTRAVENOUS
  Administered 2021-08-12: 25 ug via INTRAVENOUS
  Administered 2021-08-12 (×2): 12.5 ug via INTRAVENOUS

## 2021-08-12 MED ORDER — FENTANYL CITRATE (PF) 100 MCG/2ML IJ SOLN
INTRAMUSCULAR | Status: AC
Start: 2021-08-12 — End: ?
  Filled 2021-08-12: qty 2

## 2021-08-12 MED ORDER — HEPARIN (PORCINE) IN NACL 1000-0.9 UT/500ML-% IV SOLN
INTRAVENOUS | Status: DC | PRN
  Administered 2021-08-12: 500 mL

## 2021-08-12 MED ORDER — CEFAZOLIN SODIUM-DEXTROSE 2-4 GM/100ML-% IV SOLN
INTRAVENOUS | Status: AC
  Filled 2021-08-12: qty 100

## 2021-08-12 MED ORDER — SODIUM CHLORIDE 0.9 % IV SOLN
INTRAVENOUS | Status: AC
  Filled 2021-08-12: qty 2

## 2021-08-12 MED ORDER — LIDOCAINE HCL (PF) 1 % IJ SOLN
INTRAMUSCULAR | Status: DC | PRN
  Administered 2021-08-12: 60 mL

## 2021-08-12 MED ORDER — CHLORHEXIDINE GLUCONATE 4 % EX LIQD
4.0000 | Freq: Once | CUTANEOUS | Status: DC
Start: 2021-08-12 — End: 2021-08-12

## 2021-08-12 MED ORDER — SODIUM CHLORIDE 0.9 % IV SOLN
80.0000 mg | INTRAVENOUS | Status: AC
  Administered 2021-08-12: 80 mg

## 2021-08-12 MED ORDER — ACETAMINOPHEN 325 MG PO TABS: 325.0000 mg | ORAL_TABLET | ORAL | Status: DC | PRN

## 2021-08-12 MED ORDER — POVIDONE-IODINE 10 % EX SWAB
2.0000 "application " | Freq: Once | CUTANEOUS | Status: AC
  Administered 2021-08-12: 2 via TOPICAL

## 2021-08-12 MED ORDER — ONDANSETRON HCL 4 MG/2ML IJ SOLN: 4.0000 mg | Freq: Four times a day (QID) | INTRAMUSCULAR | Status: DC | PRN

## 2021-08-12 MED ORDER — MIDAZOLAM HCL 5 MG/5ML IJ SOLN
INTRAMUSCULAR | Status: DC | PRN
  Administered 2021-08-12 (×2): 1 mg via INTRAVENOUS
  Administered 2021-08-12: 2 mg via INTRAVENOUS
  Administered 2021-08-12 (×2): 1 mg via INTRAVENOUS

## 2021-08-12 MED ORDER — CEFAZOLIN SODIUM-DEXTROSE 2-4 GM/100ML-% IV SOLN
2.0000 g | INTRAVENOUS | Status: AC
  Administered 2021-08-12: 2 g via INTRAVENOUS

## 2021-08-12 MED ORDER — IODIXANOL 320 MG/ML IV SOLN
INTRAVENOUS | Status: DC | PRN
  Administered 2021-08-12: 10 mL

## 2021-08-12 SURGICAL SUPPLY — 14 items
CABLE SURGICAL S-101-97-12 (CABLE) ×2 IMPLANT
CATH CPS LOCATOR 3D MED (CATHETERS) ×1 IMPLANT
HELIX LOCKING TOOL (MISCELLANEOUS) ×2
LEAD TENDRIL MRI 52CM LPA1200M (Lead) ×1 IMPLANT
LEAD TENDRIL SDX 2088TC-58CM (Lead) ×1 IMPLANT
PACEMAKER ASSURITY DR-RF (Pacemaker) ×1 IMPLANT
PACK LOOP INSERTION (CUSTOM PROCEDURE TRAY) ×2 IMPLANT
PAD DEFIB RADIO PHYSIO CONN (PAD) ×2 IMPLANT
SHEATH 8FR PRELUDE SNAP 13 (SHEATH) ×1 IMPLANT
SHEATH 9FR PRELUDE SNAP 13 (SHEATH) ×1 IMPLANT
SLITTER AGILIS HISPRO (INSTRUMENTS) ×1 IMPLANT
TOOL HELIX LOCKING (MISCELLANEOUS) IMPLANT
TRAY PACEMAKER INSERTION (PACKS) ×2 IMPLANT
WIRE GUIDERIGHT .032X150 (WIRE) ×1 IMPLANT

## 2021-08-12 NOTE — Interval H&P Note (Signed)
History and Physical Interval Note: ? ?08/12/2021 ?11:29 AM ? ?Danielle Nicholson  has presented today for surgery, with the diagnosis of syncope.  The various methods of treatment have been discussed with the patient and family. After consideration of risks, benefits and other options for treatment, the patient has consented to  Procedure(s): ?PACEMAKER IMPLANT (N/A) ?LOOP RECORDER REMOVAL (N/A) as a surgical intervention.  The patient's history has been reviewed, patient examined, no change in status, stable for surgery.  I have reviewed the patient's chart and labs.  Questions were answered to the patient's satisfaction.   ? ? ?Danielle Nicholson ? ? ?

## 2021-08-12 NOTE — Discharge Instructions (Addendum)
After Your Pacemaker ? ? ?You have a St. Jude Pacemaker ? ?ACTIVITY ?Do not lift your arm above shoulder height for 1 week after your procedure. After 7 days, you may progress as below.  ?You should remove your sling 24 hours after your procedure, unless otherwise instructed by your provider.  ? ? ? Thursday Aug 19, 2021  Friday Aug 20, 2021 Saturday Aug 21, 2021 Sunday Aug 22, 2021  ? ?Do not lift, push, pull, or carry anything over 10 pounds with the affected arm until 6 weeks (Thursday September 23, 2021 ) after your procedure.  ? ?You may drive AFTER your wound check, unless you have been told otherwise by your provider.  ? ?Ask your healthcare provider when you can go back to work ? ? ?INCISION/Dressing ?If you are on a blood thinner such as Coumadin, Xarelto, Eliquis, Plavix, or Pradaxa please confirm with your provider when this should be resumed. Eliquis 08/17/21 ? ?If large square, outer bandage is left in place, this can be removed after 24 hours from your procedure. Do not remove steri-strips or glue as below.  ? ?Monitor your Pacemaker site for redness, swelling, and drainage. Call the device clinic at [phone] if you experience these symptoms or fever/chills. ? ?If your incision is sealed with Steri-strips or staples, you may shower 7 days after your procedure or when told by your provider. Do not remove the steri-strips or let the shower hit directly on your site. You may wash around your site with soap and water.  ? ?If you were discharged in a sling, please do not wear this during the day more than 48 hours after your surgery unless otherwise instructed. This may increase the risk of stiffness and soreness in your shoulder.  ? ?Avoid lotions, ointments, or perfumes over your incision until it is well-healed. ? ?You may use a hot tub or a pool AFTER your wound check appointment if the incision is completely closed. ? ?Pacemaker Alerts:  Some alerts are vibratory and others beep. These are NOT  emergencies. Please call our office to let us know. If this occurs at night or on weekends, it can wait until the next business day. Send a remote transmission. ? ?If your device is capable of reading fluid status (for heart failure), you will be offered monthly monitoring to review this with you.  ? ?DEVICE MANAGEMENT ?Remote monitoring is used to monitor your pacemaker from home. This monitoring is scheduled every 91 days by our office. It allows [place] to keep an eye on the functioning of your device to ensure it is working properly. You will routinely see your Electrophysiologist annually (more often if necessary).  ? ?You should receive your ID card for your new device in 4-8 weeks. Keep this card with you at all times once received. Consider wearing a medical alert bracelet or necklace. ? ?Your Pacemaker may be MRI compatible. This will be discussed at your next office visit/wound check.  You should avoid contact with strong electric or magnetic fields.  ? ?Do not use amateur (ham) radio equipment or electric (arc) welding torches. MP3 player headphones with magnets should not be used. Some devices are safe to use if held at least 12 inches (30 cm) from your Pacemaker. These include power tools, lawn mowers, and speakers. If you are unsure if something is safe to use, ask your health care provider. ? ?When using your cell phone, hold it to the ear that is on the opposite side from  the Pacemaker. Do not leave your cell phone in a pocket over the Pacemaker. ? ?You may safely use electric blankets, heating pads, computers, and microwave ovens. ? ?Call the office right away if: ?You have chest pain. ?You feel more short of breath than you have felt before. ?You feel more light-headed than you have felt before. ?Your incision starts to open up. ? ?This information is not intended to replace advice given to you by your health care provider. Make sure you discuss any questions you have with your health care provider.  ?

## 2021-08-13 ENCOUNTER — Encounter (HOSPITAL_COMMUNITY): Payer: Self-pay | Admitting: Internal Medicine

## 2021-08-13 ENCOUNTER — Encounter: Payer: Self-pay | Admitting: Internal Medicine

## 2021-08-13 ENCOUNTER — Telehealth: Payer: Self-pay

## 2021-08-13 NOTE — Telephone Encounter (Signed)
Follow-up after same day discharge: ?Implant date: 08/12/21 ?MD: [name], MD ?Device: St. Jude dual-chamber pacemaker/removal of ILR ?Location: Left Chest ? ? ?Wound check visit: 08/26/21 at 10:40 ?90 day MD follow-up: 11/23/21 at 2:30 ? ?Remote Transmission received:  ? ?Dressing/sling removed: yes ? ?Confirm [place] restart on: Tuesday 08/17/21  ? ?Successful telephone encounter to patient to follow up post implant. Patient states she is doing well with minimal pain. Outer dressing removed. Steristrips intact. Assisted patient with sending manual transmission. Provided patient with device clinic contact for additional questions or concerns. Patient very appreciative of call and care she has received from the EP team.  ?

## 2021-08-13 NOTE — Telephone Encounter (Signed)
-----   Message from [place], [place] sent at 08/13/2021 10:04 AM EDT ----- ?Same day d/c ? ?PPM in and loop out yesterday 08/12/21 ? ?[place] ? ?GT ? ?

## 2021-08-26 ENCOUNTER — Ambulatory Visit (INDEPENDENT_AMBULATORY_CARE_PROVIDER_SITE_OTHER): Payer: Medicare Other

## 2021-08-26 DIAGNOSIS — R55 Syncope and collapse: Secondary | ICD-10-CM

## 2021-08-26 LAB — CUP PACEART INCLINIC DEVICE CHECK
Battery Remaining Longevity: 144 mo
Battery Voltage: 3.08 V
Brady Statistic RA Percent Paced: 1.5 %
Brady Statistic RV Percent Paced: 0 %
Date Time Interrogation Session: 20230525105100
Implantable Lead Implant Date: 20230511
Implantable Lead Implant Date: 20230511
Implantable Lead Location: 753859
Implantable Lead Location: 753860
Implantable Pulse Generator Implant Date: 20230511
Lead Channel Impedance Value: 475 Ohm
Lead Channel Impedance Value: 587.5 Ohm
Lead Channel Pacing Threshold Amplitude: 0.75 V
Lead Channel Pacing Threshold Amplitude: 0.75 V
Lead Channel Pacing Threshold Amplitude: 1 V
Lead Channel Pacing Threshold Amplitude: 1 V
Lead Channel Pacing Threshold Pulse Width: 0.5 ms
Lead Channel Pacing Threshold Pulse Width: 0.5 ms
Lead Channel Pacing Threshold Pulse Width: 0.5 ms
Lead Channel Pacing Threshold Pulse Width: 0.5 ms
Lead Channel Sensing Intrinsic Amplitude: 2.6 mV
Lead Channel Sensing Intrinsic Amplitude: 9.2 mV
Lead Channel Setting Pacing Amplitude: 3.5 V
Lead Channel Setting Pacing Amplitude: 3.5 V
Lead Channel Setting Pacing Pulse Width: 0.5 ms
Lead Channel Setting Sensing Sensitivity: 2 mV
Pulse Gen Model: 2272
Pulse Gen Serial Number: 8061065

## 2021-08-26 NOTE — Progress Notes (Signed)

## 2021-08-26 NOTE — Patient Instructions (Signed)

## 2021-08-27 ENCOUNTER — Telehealth: Payer: Self-pay | Admitting: Internal Medicine

## 2021-08-27 NOTE — Telephone Encounter (Signed)
Patient walked into the office stating that she has a stitch sticking up from wound.  Does she need to be concerned.   [phone].

## 2021-08-27 NOTE — Telephone Encounter (Signed)
Spoke with patient, apt scheduled for Tuesday at 0900AM with device clinic to evaluate stitch.

## 2021-08-31 ENCOUNTER — Other Ambulatory Visit: Payer: Self-pay | Admitting: Cardiology

## 2021-08-31 ENCOUNTER — Ambulatory Visit (INDEPENDENT_AMBULATORY_CARE_PROVIDER_SITE_OTHER): Payer: Medicare Other

## 2021-08-31 NOTE — Progress Notes (Signed)
Patient seen in device clinic for stitch noted at incision site. White stitch present at incision site. Incision cleaned with alcohol pad, stitch clipped and not noted at incision site. Patient advised if stitch becomes present again to call the device clinic so we can recheck. Patient also advised to wash site with clean wash cloth, warm soapy water daily. Avoid lotion, ointments, or creams until fully healed. Advised to monitor and call if any redness,swelling or drainage noted. Patient voiced understanding.

## 2021-08-31 NOTE — Patient Instructions (Signed)
   After Your Pacemaker   Monitor your pacemaker site for redness, swelling, and drainage. Call the device clinic at [phone] if you experience these symptoms or fever/chills.  Please wash your incision daily with warm soapy water and a clean wash cloth.  If you see another stitch, please call and let us know

## 2021-09-02 ENCOUNTER — Other Ambulatory Visit: Payer: Self-pay | Admitting: Internal Medicine

## 2021-09-08 DIAGNOSIS — E041 Nontoxic single thyroid nodule: Secondary | ICD-10-CM | POA: Diagnosis not present

## 2021-10-04 ENCOUNTER — Ambulatory Visit: Payer: Medicare Other | Admitting: Cardiology

## 2021-10-14 ENCOUNTER — Encounter: Payer: Medicare Other | Admitting: Internal Medicine

## 2021-10-17 ENCOUNTER — Other Ambulatory Visit: Payer: Self-pay | Admitting: Cardiology

## 2021-10-22 DIAGNOSIS — Z299 Encounter for prophylactic measures, unspecified: Secondary | ICD-10-CM | POA: Diagnosis not present

## 2021-10-22 DIAGNOSIS — I781 Nevus, non-neoplastic: Secondary | ICD-10-CM | POA: Diagnosis not present

## 2021-10-22 DIAGNOSIS — I4891 Unspecified atrial fibrillation: Secondary | ICD-10-CM | POA: Diagnosis not present

## 2021-11-06 NOTE — Progress Notes (Signed)
Cardiology Office Note  Date: 11/08/2021   ID: Danielle Nicholson, Danielle Nicholson 04-02-1946, MRN [id number]  PCP:  Glenda Chroman, MD  Cardiologist:  Rozann Lesches, MD Electrophysiologist:  None   Chief Complaint  Patient presents with   Cardiac follow-up    History of Present Illness: Danielle Nicholson is a 76 y.o. female last seen in March.  I reviewed the interval chart.  She has a history of paroxysmal atrial fibrillation with posttermination pauses and ultimately underwent placement of a St. Jude dual-chamber pacemaker (with removal of ILR) by Dr. Lovena Le in May of this year.  She is doing well, has not had any episodes of substantial dizziness and no syncope.  Still has intermittent palpitations consistent with her atrial fibrillation.  I reviewed her medications.  She did have a venous bleed around her ankle in the interim, no other major spontaneous bleeding problems.  I reviewed her lab work from May.  She also continues to see Dr. Woody Seller.  We discussed continuing current course of therapy, she will have follow-up with Dr. Lovena Le as well.   Past Medical History:  Diagnosis Date   Diverticula of colon    Paroxysmal atrial fibrillation [facility])    Sarcoidosis 1973   Thyroid nodule    Dr. Caryl Never at [facility]   Typical atrial flutter [facility])     Past Surgical History:  Procedure Laterality Date   ABLATION SAPHENOUS VEIN W/ RFA     BREAST BIOPSY     right   Cervical node     left bx   COLONOSCOPY  11/05/2010   Procedure: COLONOSCOPY;  Surgeon: Rogene Houston, MD;  [facility];  Service: Endoscopy;  Laterality: N/A;  9:30 am   COLONOSCOPY N/A 02/22/2017   Procedure: COLONOSCOPY;  Surgeon: Rogene Houston, MD;  [facility];  Service: Endoscopy;  Laterality: N/A;  [place]     right   LOOP RECORDER REMOVAL N/A 08/12/2021   Procedure: LOOP RECORDER REMOVAL;  Surgeon: Evans Lance, MD;  Location: [place] CV LAB;  Service: Cardiovascular;  Laterality:  N/A;   PACEMAKER IMPLANT N/A 08/12/2021   Procedure: PACEMAKER IMPLANT;  Surgeon: Evans Lance, MD;  Location: [place] CV LAB;  Service: Cardiovascular;  Laterality: N/A;   POLYPECTOMY  02/22/2017   Procedure: POLYPECTOMY;  Surgeon: Rogene Houston, MD;  [facility];  Service: Endoscopy;;  colon    TOTAL KNEE ARTHROPLASTY  11/02/2011   Procedure: TOTAL KNEE ARTHROPLASTY;  Surgeon: Gearlean Alf, MD;  [facility];  Service: Orthopedics;  Laterality: Left;    Current Outpatient Medications  Medication Sig Dispense Refill   acetaminophen (TYLENOL) 650 MG CR tablet Take 650 mg daily as needed by mouth for pain.     apixaban (ELIQUIS) 5 MG TABS tablet TAKE 1 TABLET BY MOUTH TWICE DAILY 60 tablet 11   flecainide (TAMBOCOR) 100 MG tablet TAKE 1 TABLET BY MOUTH TWICE DAILY 180 tablet 1   metoprolol succinate (TOPROL-XL) 25 MG 24 hr tablet TAKE 1/2 TABLET BY MOUTH DAILY 45 tablet 3   No current facility-administered medications for this visit.   Allergies:  Cardizem [diltiazem hcl]   ROS: No frank syncope.  No orthopnea or PND.  Physical Exam: VS:  BP 118/70   Pulse 70   Ht '5\' 10"'$  (1.778 m)   Wt 159 lb 3.2 oz (72.2 kg)   SpO2 96%   BMI 22.84 kg/m ,  BMI Body mass index is 22.84 kg/m.  Wt Readings from Last 3 Encounters:  11/08/21 159 lb 3.2 oz (72.2 kg)  08/12/21 162 lb (73.5 kg)  08/09/21 163 lb 3.2 oz (74 kg)    General: Patient appears comfortable at rest. HEENT: Conjunctiva and lids normal. Cardiac: Regular rate and rhythm, no S3 or significant systolic murmur, no pericardial rub.  ECG:  An ECG dated 08/12/2021 was personally reviewed today and demonstrated:  Sinus rhythm with prolonged PR interval, low voltage and leftward axis.  Recent Labwork: 08/09/2021: BUN 28; Creatinine, Ser 0.79; Hemoglobin 14.1; Platelets 239; Potassium 5.4; Sodium 140   Other Studies Reviewed Today:  Echocardiogram 10/04/2017: - Left ventricle: The cavity size was normal. Wall  thickness was    increased in a pattern of mild LVH. Systolic function was normal.    The estimated ejection fraction was in the range of 60% to 65%.    Wall motion was normal; there were no regional wall motion    abnormalities. Left ventricular diastolic function parameters    were normal.    GXT 04/09/2021:   Fair exercise capacity, achieved 7.0 METS   Max heart rate 92 bpm (63% max age predicted heart rate)   Normal blood pressure resonse to exercise   No ST deviation was noted.   No EKG evidence of ischemia, but study is not diagnostic due to failure to achieve goal heart rate  Assessment and Plan:  1.  Paroxysmal atrial fibrillation and typical atrial flutter with CHA2DS2-VASc score of 3.  Symptomatically stable in terms of palpitations on flecainide and Toprol-XL (currently 12.5 mg daily).  Continue Eliquis for stroke prophylaxis, I reviewed her lab work from May.  If palpitations increase, could consider advancing Toprol-XL dose now that she has pacemaker in place.  2.  Tachycardia-bradycardia syndrome with symptomatic posttermination pauses.  Now status post St. Jude pacemaker implantation by Dr. Lovena Le.  Medication Adjustments/Labs and Tests Ordered: Current medicines are reviewed at length with the patient today.  Concerns regarding medicines are outlined above.   Tests Ordered: No orders of the defined types were placed in this encounter.   Medication Changes: No orders of the defined types were placed in this encounter.   Disposition:  Follow up  6 months.  Signed, Satira Sark, MD, [facility] 11/08/2021 9:21 AM    [place] at [address], [place], [place] [address] Phone: [phone]; Fax: [phone]

## 2021-11-08 ENCOUNTER — Ambulatory Visit: Payer: Medicare Other | Admitting: Cardiology

## 2021-11-08 ENCOUNTER — Encounter: Payer: Self-pay | Admitting: Cardiology

## 2021-11-08 VITALS — BP 118/70 | HR 70 | Ht 70.0 in | Wt 159.2 lb

## 2021-11-08 DIAGNOSIS — I495 Sick sinus syndrome: Secondary | ICD-10-CM

## 2021-11-08 DIAGNOSIS — I480 Paroxysmal atrial fibrillation: Secondary | ICD-10-CM

## 2021-11-08 NOTE — Patient Instructions (Signed)

## 2021-11-12 ENCOUNTER — Ambulatory Visit (INDEPENDENT_AMBULATORY_CARE_PROVIDER_SITE_OTHER): Payer: Medicare Other

## 2021-11-12 DIAGNOSIS — I495 Sick sinus syndrome: Secondary | ICD-10-CM

## 2021-11-12 DIAGNOSIS — Z0000 Encounter for general adult medical examination without abnormal findings: Secondary | ICD-10-CM | POA: Diagnosis not present

## 2021-11-12 DIAGNOSIS — R5383 Other fatigue: Secondary | ICD-10-CM | POA: Diagnosis not present

## 2021-11-12 DIAGNOSIS — Z79899 Other long term (current) drug therapy: Secondary | ICD-10-CM | POA: Diagnosis not present

## 2021-11-12 DIAGNOSIS — Z6822 Body mass index (BMI) 22.0-22.9, adult: Secondary | ICD-10-CM | POA: Diagnosis not present

## 2021-11-12 DIAGNOSIS — E7800 Pure hypercholesterolemia, unspecified: Secondary | ICD-10-CM | POA: Diagnosis not present

## 2021-11-12 DIAGNOSIS — Z299 Encounter for prophylactic measures, unspecified: Secondary | ICD-10-CM | POA: Diagnosis not present

## 2021-11-12 LAB — CUP PACEART REMOTE DEVICE CHECK
Battery Remaining Longevity: 94 mo
Battery Remaining Percentage: 95.5 %
Battery Voltage: 3.02 V
Brady Statistic AP VP Percent: 1 %
Brady Statistic AP VS Percent: 20 %
Brady Statistic AS VP Percent: 1 %
Brady Statistic AS VS Percent: 80 %
Brady Statistic RA Percent Paced: 20 %
Brady Statistic RV Percent Paced: 1 %
Date Time Interrogation Session: 20230811040013
Implantable Lead Implant Date: 20230511
Implantable Lead Implant Date: 20230511
Implantable Lead Location: 753859
Implantable Lead Location: 753860
Implantable Pulse Generator Implant Date: 20230511
Lead Channel Impedance Value: 450 Ohm
Lead Channel Impedance Value: 530 Ohm
Lead Channel Pacing Threshold Amplitude: 0.75 V
Lead Channel Pacing Threshold Amplitude: 1 V
Lead Channel Pacing Threshold Pulse Width: 0.5 ms
Lead Channel Pacing Threshold Pulse Width: 0.5 ms
Lead Channel Sensing Intrinsic Amplitude: 12 mV
Lead Channel Sensing Intrinsic Amplitude: 4 mV
Lead Channel Setting Pacing Amplitude: 3.5 V
Lead Channel Setting Pacing Amplitude: 3.5 V
Lead Channel Setting Pacing Pulse Width: 0.5 ms
Lead Channel Setting Sensing Sensitivity: 2 mV
Pulse Gen Model: 2272
Pulse Gen Serial Number: 8061065

## 2021-11-15 DIAGNOSIS — R5383 Other fatigue: Secondary | ICD-10-CM | POA: Diagnosis not present

## 2021-11-15 DIAGNOSIS — Z79899 Other long term (current) drug therapy: Secondary | ICD-10-CM | POA: Diagnosis not present

## 2021-11-15 DIAGNOSIS — E7800 Pure hypercholesterolemia, unspecified: Secondary | ICD-10-CM | POA: Diagnosis not present

## 2021-11-23 ENCOUNTER — Encounter: Payer: Self-pay | Admitting: Internal Medicine

## 2021-11-23 ENCOUNTER — Ambulatory Visit (INDEPENDENT_AMBULATORY_CARE_PROVIDER_SITE_OTHER): Payer: Medicare Other | Admitting: Internal Medicine

## 2021-11-23 DIAGNOSIS — I495 Sick sinus syndrome: Secondary | ICD-10-CM

## 2021-11-23 DIAGNOSIS — Z950 Presence of cardiac pacemaker: Secondary | ICD-10-CM

## 2021-11-23 DIAGNOSIS — I480 Paroxysmal atrial fibrillation: Secondary | ICD-10-CM

## 2021-11-23 NOTE — Patient Instructions (Addendum)
Medication Instructions:  Your physician recommends that you continue on your current medications as directed. Please refer to the Current Medication list given to you today.  *If you need a refill on your cardiac medications before your next appointment, please call your pharmacy*  NO CHANGES TO YOUR MEDICATIONS.   Lab Work: None ordered.  If you have labs (blood work) drawn today and your tests are completely normal, you will receive your results only by: [place] (if you have MyChart) OR A paper copy in the mail If you have any lab test that is abnormal or we need to change your treatment, we will call you to review the results.  Testing/Procedures: None ordered.  Follow-Up:  IN 1 YEAR WITH DR. [place], [place].   Remote monitoring is used to monitor your Pacemaker/ ICD from home. This monitoring reduces the number of office visits required to check your device to one time per year. It allows [place] to keep an eye on the functioning of your device to ensure it is working properly. You are scheduled for a device check from home on 02/11/22. You may send your transmission at any time that day. If you have a wireless device, the transmission will be sent automatically. After your physician reviews your transmission, you will receive a postcard with your next transmission date.  Important Information About [place]

## 2021-11-23 NOTE — Progress Notes (Signed)
HPI Danielle Nicholson returns today for followup of sinus node dysfunction. She is a pleasant 76 yo woman with PAF and sinus node dysfunction s/p PPM insertion due to long pauses. She is doing well. She denies chest pain or sob. She does not have palpitations. She has been on flecainide and is arrhythmia free.  Allergies  Allergen Reactions   Cardizem [Diltiazem Hcl] Hives and Itching     Current Outpatient Medications  Medication Sig Dispense Refill   acetaminophen (TYLENOL) 650 MG CR tablet Take 650 mg daily as needed by mouth for pain.     apixaban (ELIQUIS) 5 MG TABS tablet TAKE 1 TABLET BY MOUTH TWICE DAILY 60 tablet 11   flecainide (TAMBOCOR) 100 MG tablet TAKE 1 TABLET BY MOUTH TWICE DAILY 180 tablet 1   metoprolol succinate (TOPROL-XL) 25 MG 24 hr tablet TAKE 1/2 TABLET BY MOUTH DAILY 45 tablet 3   No current facility-administered medications for this visit.     Past Medical History:  Diagnosis Date   Diverticula of colon    Paroxysmal atrial fibrillation [facility])    Sarcoidosis 1973   Thyroid nodule    Dr. Caryl Never at [facility]   Typical atrial flutter ([place])     ROS:   All systems reviewed and negative except as noted in the HPI.   Past Surgical History:  Procedure Laterality Date   ABLATION SAPHENOUS VEIN W/ RFA     BREAST BIOPSY     right   Cervical node     left bx   COLONOSCOPY  11/05/2010   Procedure: COLONOSCOPY;  Surgeon: Rogene Houston, MD;  [facility];  Service: Endoscopy;  Laterality: N/A;  9:30 am   COLONOSCOPY N/A 02/22/2017   Procedure: COLONOSCOPY;  Surgeon: Rogene Houston, MD;  [facility];  Service: Endoscopy;  Laterality: N/A;  [place]     right   LOOP RECORDER REMOVAL N/A 08/12/2021   Procedure: LOOP RECORDER REMOVAL;  Surgeon: Evans Lance, MD;  Location: [place] CV LAB;  Service: Cardiovascular;  Laterality: N/A;   PACEMAKER IMPLANT N/A 08/12/2021   Procedure: PACEMAKER IMPLANT;  Surgeon: Evans Lance, MD;  Location: [place] CV LAB;  Service: Cardiovascular;  Laterality: N/A;   POLYPECTOMY  02/22/2017   Procedure: POLYPECTOMY;  Surgeon: Rogene Houston, MD;  [facility];  Service: Endoscopy;;  colon    TOTAL KNEE ARTHROPLASTY  11/02/2011   Procedure: TOTAL KNEE ARTHROPLASTY;  Surgeon: Gearlean Alf, MD;  [facility];  Service: Orthopedics;  Laterality: Left;     Family History  Problem Relation Age of Onset   Colon cancer Mother      Social History   Socioeconomic History   Marital status: Married    Spouse name: Not on file   Number of children: Not on file   Years of education: Not on file   Highest education level: Not on file  Occupational History   Not on file  Tobacco Use   Smoking status: Never   Smokeless tobacco: Never  Vaping Use   Vaping Use: Never used  Substance and Sexual Activity   Alcohol use: Yes    Alcohol/week: 2.0 standard drinks of alcohol    Types: 2 Glasses of wine per week    Comment: Wine- Weekends ( maybe )   Drug use: No   Sexual activity: Not on file  Other Topics Concern  Not on file  Social History Narrative   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 [place] Strain: Not on file  Food Insecurity: Not on file  Transportation Needs: Not on file  Physical Activity: Not on file  Stress: Not on file  Social Connections: Not on file  Intimate Partner Violence: Not on file     BP 96/70   Pulse 68   Ht '5\' 10"'$  (1.778 m)   Wt 159 lb 3.2 oz (72.2 kg)   SpO2 98%   BMI 22.84 kg/m   Physical Exam:  Well appearing NAD HEENT: Unremarkable Neck:  No JVD, no thyromegally Lymphatics:  No adenopathy Back:  No CVA tenderness Lungs:  Clear with no wheezes HEART:  Regular rate rhythm, no murmurs, no rubs, no clicks Abd:  soft, positive bowel sounds, no organomegally, no rebound, no guarding Ext:  2 plus pulses, no edema, no cyanosis, no  clubbing Skin:  No rashes no nodules Neuro:  CN II through XII intact, motor grossly intact  EKG - nsr  DEVICE  Normal device function.  See PaceArt for details.   Assess/Plan:  Sinus node dysfunction - she is asymptomatic s/p PPM insertion.  PPM - her St. Jude DDD PM is working normally. We will recheck in several months. HTN - her bp is well controlled, actually low. She is asymptomatic. PAF - she is maintaining NSR.   Carleene Overlie Nissim Fleischer,MD

## 2021-12-05 NOTE — Progress Notes (Signed)
Remote pacemaker transmission.   

## 2022-02-11 ENCOUNTER — Ambulatory Visit (INDEPENDENT_AMBULATORY_CARE_PROVIDER_SITE_OTHER): Payer: Medicare Other

## 2022-02-11 DIAGNOSIS — I495 Sick sinus syndrome: Secondary | ICD-10-CM

## 2022-02-12 LAB — CUP PACEART REMOTE DEVICE CHECK
Battery Remaining Longevity: 118 mo
Battery Remaining Percentage: 95.5 %
Battery Voltage: 3.02 V
Brady Statistic AP VP Percent: 1 %
Brady Statistic AP VS Percent: 25 %
Brady Statistic AS VP Percent: 1 %
Brady Statistic AS VS Percent: 75 %
Brady Statistic RA Percent Paced: 25 %
Brady Statistic RV Percent Paced: 1 %
Date Time Interrogation Session: 20231110030012
Implantable Lead Connection Status: 753985
Implantable Lead Connection Status: 753985
Implantable Lead Implant Date: 20230511
Implantable Lead Implant Date: 20230511
Implantable Lead Location: 753859
Implantable Lead Location: 753860
Implantable Pulse Generator Implant Date: 20230511
Lead Channel Impedance Value: 400 Ohm
Lead Channel Impedance Value: 530 Ohm
Lead Channel Pacing Threshold Amplitude: 0.75 V
Lead Channel Pacing Threshold Amplitude: 1 V
Lead Channel Pacing Threshold Pulse Width: 0.5 ms
Lead Channel Pacing Threshold Pulse Width: 0.5 ms
Lead Channel Sensing Intrinsic Amplitude: 1.3 mV
Lead Channel Sensing Intrinsic Amplitude: 12 mV
Lead Channel Setting Pacing Amplitude: 2 V
Lead Channel Setting Pacing Amplitude: 2.5 V
Lead Channel Setting Pacing Pulse Width: 0.5 ms
Lead Channel Setting Sensing Sensitivity: 2 mV
Pulse Gen Model: 2272
Pulse Gen Serial Number: 8061065

## 2022-02-16 NOTE — Progress Notes (Signed)
Remote pacemaker transmission.   

## 2022-02-21 DIAGNOSIS — H40013 Open angle with borderline findings, low risk, bilateral: Secondary | ICD-10-CM | POA: Diagnosis not present

## 2022-02-21 DIAGNOSIS — H04123 Dry eye syndrome of bilateral lacrimal glands: Secondary | ICD-10-CM | POA: Diagnosis not present

## 2022-02-21 DIAGNOSIS — H401431 Capsular glaucoma with pseudoexfoliation of lens, bilateral, mild stage: Secondary | ICD-10-CM | POA: Diagnosis not present

## 2022-02-21 DIAGNOSIS — H35363 Drusen (degenerative) of macula, bilateral: Secondary | ICD-10-CM | POA: Diagnosis not present

## 2022-02-21 DIAGNOSIS — H524 Presbyopia: Secondary | ICD-10-CM | POA: Diagnosis not present

## 2022-04-06 ENCOUNTER — Encounter: Payer: Self-pay | Admitting: Internal Medicine

## 2022-04-13 ENCOUNTER — Other Ambulatory Visit: Payer: Self-pay | Admitting: Cardiology

## 2022-05-11 NOTE — Progress Notes (Unsigned)
Cardiology Office Note  Date: 05/12/2022   ID: [place], [place] [birth year], MRN [id number]  PCP:  Glenda Chroman, MD  Cardiologist:  Rozann Lesches, MD Electrophysiologist:  None   Chief Complaint  Patient presents with   Cardiac follow-up    History of Present Illness: Danielle Nicholson is a 77 y.o. female last seen in August 2023.  She has had interval follow-up with Dr. Lovena Le, I reviewed the notes.  She is here for a routine visit, overall doing well.  No further episodes of dizziness or syncope.  She stays active is much as she can.  St. Jude dual-chamber pacemaker in place with follow-up by Dr. Lovena Le.  Device check in November revealed overall normal device function with brief episodes of atrial arrhythmia noted.  I personally reviewed her ECG today which shows an atrial paced rhythm with leftward axis.  I also reviewed her medications which are stable.  She will have follow-up lab work with PCP in April.  No reported bleeding problems on Eliquis.  Past Medical History:  Diagnosis Date   Diverticula of colon    Paroxysmal atrial fibrillation [facility])    Sarcoidosis 1973   Thyroid nodule    Dr. Caryl Never at [facility]   Typical atrial flutter [facility])     Current Outpatient Medications  Medication Sig Dispense Refill   acetaminophen (TYLENOL) 650 MG CR tablet Take 650 mg daily as needed by mouth for pain.     apixaban (ELIQUIS) 5 MG TABS tablet TAKE 1 TABLET BY MOUTH TWICE DAILY 60 tablet 11   flecainide (TAMBOCOR) 100 MG tablet TAKE 1 TABLET BY MOUTH TWICE DAILY 180 tablet 2   metoprolol succinate (TOPROL-XL) 25 MG 24 hr tablet TAKE 1/2 TABLET BY MOUTH DAILY 45 tablet 3   No current facility-administered medications for this visit.   Allergies:  Cardizem [diltiazem hcl]   ROS: No orthopnea or PND.  Physical Exam: VS:  BP 124/72   Pulse 61   Ht '5\' 10"'$  (1.778 m)   Wt 157 lb 9.6 oz (71.5 kg)   BMI 22.61 kg/m , BMI Body mass index is 22.61 kg/m.  Wt Readings from Last 3  Encounters:  05/12/22 157 lb 9.6 oz (71.5 kg)  11/23/21 159 lb 3.2 oz (72.2 kg)  11/08/21 159 lb 3.2 oz (72.2 kg)    General: Patient appears comfortable at rest. HEENT: Conjunctiva and lids normal. Lungs: Clear to auscultation, nonlabored breathing at rest. Cardiac: Regular rate and rhythm, no S3 or significant systolic murmur.  ECG:  An ECG dated 11/23/2021 was personally reviewed today and demonstrated:  Sinus rhythm.  Recent Labwork: 08/09/2021: BUN 28; Creatinine, Ser 0.79; Hemoglobin 14.1; Platelets 239; Potassium 5.4; Sodium 140  August 2023: Hemoglobin 13.9, platelets 220, BUN 21, creatinine 0.78, potassium 4.6, AST 20, ALT 16, cholesterol 152, triglycerides 68, HDL 59, LDL 80, TSH 2.28  Other Studies Reviewed Today:  GXT 04/09/2021:   Fair exercise capacity, achieved 7.0 METS   Max heart rate 92 bpm (63% max age predicted heart rate)   Normal blood pressure resonse to exercise   No ST deviation was noted.   No EKG evidence of ischemia, but study is not diagnostic due to failure to achieve goal heart rate  Assessment and Plan:  1.  Paroxysmal atrial fibrillation and typical atrial flutter with CHA2DS2-VASc score of 3.  She is symptomatically stable on current regimen including flecainide and Toprol-XL.  Also continues Eliquis for stroke prophylaxis.  She reports  no spontaneous bleeding problems.  Follow-up repeat lab work with PCP as scheduled in April.  No changes were made today.  2.  Tachycardia-bradycardia syndrome with symptomatic posttermination pauses now status post St. Jude pacemaker and doing quite well.  Keep follow-up with Dr. Lovena Le.  Medication Adjustments/Labs and Tests Ordered: Current medicines are reviewed at length with the patient today.  Concerns regarding medicines are outlined above.   Tests Ordered: Orders Placed This Encounter  Procedures   EKG 12-Lead    Medication Changes: No orders of the defined types were placed in this  encounter.   Disposition:  Follow up  6 months.  Signed, Satira Sark, MD, [facility] 05/12/2022 9:05 AM    [place] at [address], [place], [place] [address] Phone: [phone]; Fax: [phone]

## 2022-05-12 ENCOUNTER — Encounter: Payer: Self-pay | Admitting: Cardiology

## 2022-05-12 ENCOUNTER — Ambulatory Visit: Payer: Medicare Other | Attending: Cardiology | Admitting: Cardiology

## 2022-05-12 VITALS — BP 124/72 | HR 61 | Ht 70.0 in | Wt 157.6 lb

## 2022-05-12 DIAGNOSIS — I495 Sick sinus syndrome: Secondary | ICD-10-CM | POA: Diagnosis not present

## 2022-05-12 DIAGNOSIS — I480 Paroxysmal atrial fibrillation: Secondary | ICD-10-CM | POA: Diagnosis not present

## 2022-05-12 NOTE — Patient Instructions (Addendum)

## 2022-05-13 ENCOUNTER — Ambulatory Visit: Payer: Medicare Other

## 2022-05-13 DIAGNOSIS — I495 Sick sinus syndrome: Secondary | ICD-10-CM

## 2022-05-13 LAB — CUP PACEART REMOTE DEVICE CHECK
Battery Remaining Longevity: 117 mo
Battery Remaining Percentage: 95.5 %
Battery Voltage: 3.02 V
Brady Statistic AP VP Percent: 1 %
Brady Statistic AP VS Percent: 28 %
Brady Statistic AS VP Percent: 1 %
Brady Statistic AS VS Percent: 72 %
Brady Statistic RA Percent Paced: 27 %
Brady Statistic RV Percent Paced: 1 %
Date Time Interrogation Session: 20240209040014
Implantable Lead Connection Status: 753985
Implantable Lead Connection Status: 753985
Implantable Lead Implant Date: 20230511
Implantable Lead Implant Date: 20230511
Implantable Lead Location: 753859
Implantable Lead Location: 753860
Implantable Pulse Generator Implant Date: 20230511
Lead Channel Impedance Value: 450 Ohm
Lead Channel Impedance Value: 530 Ohm
Lead Channel Pacing Threshold Amplitude: 0.75 V
Lead Channel Pacing Threshold Amplitude: 1 V
Lead Channel Pacing Threshold Pulse Width: 0.5 ms
Lead Channel Pacing Threshold Pulse Width: 0.5 ms
Lead Channel Sensing Intrinsic Amplitude: 12 mV
Lead Channel Sensing Intrinsic Amplitude: 2.7 mV
Lead Channel Setting Pacing Amplitude: 2 V
Lead Channel Setting Pacing Amplitude: 2.5 V
Lead Channel Setting Pacing Pulse Width: 0.5 ms
Lead Channel Setting Sensing Sensitivity: 2 mV
Pulse Gen Model: 2272
Pulse Gen Serial Number: 8061065

## 2022-05-31 NOTE — Progress Notes (Signed)
Remote pacemaker transmission.   

## 2022-06-01 ENCOUNTER — Other Ambulatory Visit: Payer: Self-pay | Admitting: Cardiology

## 2022-06-01 NOTE — Telephone Encounter (Signed)
Prescription refill request for Eliquis received. Indication: PAF Last office visit: 05/12/22  Myles Gip MD Scr: 0.79 on 08/09/21  Epic Age: 77 Weight: 71.5kg  Based on above findings Eliquis '5mg'$  twice daily is the appropriate dose.  Refill approved.

## 2022-07-14 DIAGNOSIS — H04123 Dry eye syndrome of bilateral lacrimal glands: Secondary | ICD-10-CM | POA: Diagnosis not present

## 2022-07-14 DIAGNOSIS — H43813 Vitreous degeneration, bilateral: Secondary | ICD-10-CM | POA: Diagnosis not present

## 2022-07-21 DIAGNOSIS — H532 Diplopia: Secondary | ICD-10-CM | POA: Diagnosis not present

## 2022-08-04 DIAGNOSIS — I480 Paroxysmal atrial fibrillation: Secondary | ICD-10-CM | POA: Diagnosis not present

## 2022-08-04 DIAGNOSIS — Z299 Encounter for prophylactic measures, unspecified: Secondary | ICD-10-CM | POA: Diagnosis not present

## 2022-08-04 DIAGNOSIS — H532 Diplopia: Secondary | ICD-10-CM | POA: Diagnosis not present

## 2022-08-04 DIAGNOSIS — Z0000 Encounter for general adult medical examination without abnormal findings: Secondary | ICD-10-CM | POA: Diagnosis not present

## 2022-08-04 DIAGNOSIS — Z7189 Other specified counseling: Secondary | ICD-10-CM | POA: Diagnosis not present

## 2022-08-04 DIAGNOSIS — Z6822 Body mass index (BMI) 22.0-22.9, adult: Secondary | ICD-10-CM | POA: Diagnosis not present

## 2022-08-12 ENCOUNTER — Ambulatory Visit (INDEPENDENT_AMBULATORY_CARE_PROVIDER_SITE_OTHER): Payer: Medicare Other

## 2022-08-12 DIAGNOSIS — I495 Sick sinus syndrome: Secondary | ICD-10-CM

## 2022-08-12 LAB — CUP PACEART REMOTE DEVICE CHECK
Battery Remaining Longevity: 113 mo
Battery Remaining Percentage: 94 %
Battery Voltage: 3.02 V
Brady Statistic AP VP Percent: 1 %
Brady Statistic AP VS Percent: 28 %
Brady Statistic AS VP Percent: 1 %
Brady Statistic AS VS Percent: 72 %
Brady Statistic RA Percent Paced: 27 %
Brady Statistic RV Percent Paced: 1 %
Date Time Interrogation Session: 20240510040014
Implantable Lead Connection Status: 753985
Implantable Lead Connection Status: 753985
Implantable Lead Implant Date: 20230511
Implantable Lead Implant Date: 20230511
Implantable Lead Location: 753859
Implantable Lead Location: 753860
Implantable Pulse Generator Implant Date: 20230511
Lead Channel Impedance Value: 440 Ohm
Lead Channel Impedance Value: 510 Ohm
Lead Channel Pacing Threshold Amplitude: 0.75 V
Lead Channel Pacing Threshold Amplitude: 1 V
Lead Channel Pacing Threshold Pulse Width: 0.5 ms
Lead Channel Pacing Threshold Pulse Width: 0.5 ms
Lead Channel Sensing Intrinsic Amplitude: 1.2 mV
Lead Channel Sensing Intrinsic Amplitude: 12 mV
Lead Channel Setting Pacing Amplitude: 2 V
Lead Channel Setting Pacing Amplitude: 2.5 V
Lead Channel Setting Pacing Pulse Width: 0.5 ms
Lead Channel Setting Sensing Sensitivity: 2 mV
Pulse Gen Model: 2272
Pulse Gen Serial Number: 8061065

## 2022-08-22 DIAGNOSIS — R270 Ataxia, unspecified: Secondary | ICD-10-CM | POA: Diagnosis not present

## 2022-08-30 NOTE — Progress Notes (Signed)
Remote pacemaker transmission.   

## 2022-09-07 DIAGNOSIS — K118 Other diseases of salivary glands: Secondary | ICD-10-CM | POA: Diagnosis not present

## 2022-09-15 DIAGNOSIS — H04123 Dry eye syndrome of bilateral lacrimal glands: Secondary | ICD-10-CM | POA: Diagnosis not present

## 2022-09-15 DIAGNOSIS — H43813 Vitreous degeneration, bilateral: Secondary | ICD-10-CM | POA: Diagnosis not present

## 2022-09-16 DIAGNOSIS — R350 Frequency of micturition: Secondary | ICD-10-CM | POA: Diagnosis not present

## 2022-09-16 DIAGNOSIS — N390 Urinary tract infection, site not specified: Secondary | ICD-10-CM | POA: Diagnosis not present

## 2022-09-16 DIAGNOSIS — D692 Other nonthrombocytopenic purpura: Secondary | ICD-10-CM | POA: Diagnosis not present

## 2022-09-16 DIAGNOSIS — I480 Paroxysmal atrial fibrillation: Secondary | ICD-10-CM | POA: Diagnosis not present

## 2022-09-16 DIAGNOSIS — D6869 Other thrombophilia: Secondary | ICD-10-CM | POA: Diagnosis not present

## 2022-09-16 DIAGNOSIS — Z299 Encounter for prophylactic measures, unspecified: Secondary | ICD-10-CM | POA: Diagnosis not present

## 2022-09-19 ENCOUNTER — Other Ambulatory Visit: Payer: Self-pay | Admitting: Internal Medicine

## 2022-10-10 DIAGNOSIS — M7071 Other bursitis of hip, right hip: Secondary | ICD-10-CM | POA: Diagnosis not present

## 2022-10-10 DIAGNOSIS — Z299 Encounter for prophylactic measures, unspecified: Secondary | ICD-10-CM | POA: Diagnosis not present

## 2022-10-28 DIAGNOSIS — H524 Presbyopia: Secondary | ICD-10-CM | POA: Diagnosis not present

## 2022-10-28 DIAGNOSIS — H40013 Open angle with borderline findings, low risk, bilateral: Secondary | ICD-10-CM | POA: Diagnosis not present

## 2022-10-28 DIAGNOSIS — H401430 Capsular glaucoma with pseudoexfoliation of lens, bilateral, stage unspecified: Secondary | ICD-10-CM | POA: Diagnosis not present

## 2022-10-28 DIAGNOSIS — H35363 Drusen (degenerative) of macula, bilateral: Secondary | ICD-10-CM | POA: Diagnosis not present

## 2022-10-28 DIAGNOSIS — H26492 Other secondary cataract, left eye: Secondary | ICD-10-CM | POA: Diagnosis not present

## 2022-10-28 DIAGNOSIS — H04123 Dry eye syndrome of bilateral lacrimal glands: Secondary | ICD-10-CM | POA: Diagnosis not present

## 2022-11-11 ENCOUNTER — Ambulatory Visit: Payer: Medicare Other

## 2022-11-11 DIAGNOSIS — I480 Paroxysmal atrial fibrillation: Secondary | ICD-10-CM

## 2022-11-11 LAB — CUP PACEART REMOTE DEVICE CHECK
Battery Remaining Longevity: 112 mo
Battery Remaining Percentage: 92 %
Battery Voltage: 3.02 V
Brady Statistic AP VP Percent: 1 %
Brady Statistic AP VS Percent: 28 %
Brady Statistic AS VP Percent: 1 %
Brady Statistic AS VS Percent: 72 %
Brady Statistic RA Percent Paced: 27 %
Brady Statistic RV Percent Paced: 1 %
Date Time Interrogation Session: 20240809040019
Implantable Lead Connection Status: 753985
Implantable Lead Connection Status: 753985
Implantable Lead Implant Date: 20230511
Implantable Lead Implant Date: 20230511
Implantable Lead Location: 753859
Implantable Lead Location: 753860
Implantable Pulse Generator Implant Date: 20230511
Lead Channel Impedance Value: 460 Ohm
Lead Channel Impedance Value: 550 Ohm
Lead Channel Pacing Threshold Amplitude: 0.75 V
Lead Channel Pacing Threshold Amplitude: 1 V
Lead Channel Pacing Threshold Pulse Width: 0.5 ms
Lead Channel Pacing Threshold Pulse Width: 0.5 ms
Lead Channel Sensing Intrinsic Amplitude: 1.8 mV
Lead Channel Sensing Intrinsic Amplitude: 11.5 mV
Lead Channel Setting Pacing Amplitude: 2 V
Lead Channel Setting Pacing Amplitude: 2.5 V
Lead Channel Setting Pacing Pulse Width: 0.5 ms
Lead Channel Setting Sensing Sensitivity: 2 mV
Pulse Gen Model: 2272
Pulse Gen Serial Number: 8061065

## 2022-11-15 DIAGNOSIS — Z299 Encounter for prophylactic measures, unspecified: Secondary | ICD-10-CM | POA: Diagnosis not present

## 2022-11-15 DIAGNOSIS — Z79899 Other long term (current) drug therapy: Secondary | ICD-10-CM | POA: Diagnosis not present

## 2022-11-15 DIAGNOSIS — R5383 Other fatigue: Secondary | ICD-10-CM | POA: Diagnosis not present

## 2022-11-15 DIAGNOSIS — Z0000 Encounter for general adult medical examination without abnormal findings: Secondary | ICD-10-CM | POA: Diagnosis not present

## 2022-11-15 DIAGNOSIS — E7800 Pure hypercholesterolemia, unspecified: Secondary | ICD-10-CM | POA: Diagnosis not present

## 2022-11-15 NOTE — Progress Notes (Unsigned)
    Cardiology Office Note  Date: 11/16/2022   ID: Toronda, Rostami 12/06/1945, MRN [id number]  History of Present Illness: Danielle Nicholson is a 77 y.o. female last seen in February.  She is here for a routine visit.  Reports a recent feeling of slow heart rate, checked her peripheral pulse in the "40s" and felt like she had strong beats alternating with weak beats.  Interestingly, this occurred before her most recent device interrogation which showed normal function and fairly low AF burden.  She did not have any syncope.  Reports compliance with all of her medications as well.  She remains on flecainide and did not have any evidence of proarrhythmia by GXT last year.  No exertional chest pain or increasing dyspnea on exertion.  St. Jude dual-chamber pacemaker in place with follow-up by Dr. Ladona Ridgel.  Device interrogation recently showed normal device function with recurrent atrial arrhythmias, although very low rhythm burden.  She has a visit pending to see Dr. Ladona Ridgel.  ECG today shows sinus rhythm with leftward axis, low voltage and decreased R wave progression.  Physical Exam: VS:  BP 120/74   Pulse 70   Ht 5\' 10"  (1.778 m)   Wt 155 lb 9.6 oz (70.6 kg)   SpO2 98%   BMI 22.33 kg/m , BMI Body mass index is 22.33 kg/m.  Wt Readings from Last 3 Encounters:  11/16/22 155 lb 9.6 oz (70.6 kg)  05/12/22 157 lb 9.6 oz (71.5 kg)  11/23/21 159 lb 3.2 oz (72.2 kg)    General: Patient appears comfortable at rest. HEENT: Conjunctiva and lids normal. Neck: Supple, no elevated JVP or carotid bruits. Lungs: Clear to auscultation, nonlabored breathing at rest. Cardiac: Regular rate and rhythm, no S3 or significant systolic murmur.  ECG:  An ECG dated 05/12/2022 was personally reviewed today and demonstrated:  Atrial paced rhythm with leftward axis.  Labwork:  August 2023: Hemoglobin 13.9, platelets 220, BUN 21, creatinine 0.78, potassium 4.6, AST 20, ALT 16, cholesterol 152, triglycerides 68, HDL  59, LDL 80, TSH 2.28   Other Studies Reviewed Today:  No interval cardiac testing for review today.  Assessment and Plan:  1.  Paroxysmal atrial fibrillation and typical atrial flutter with CHA2DS2-VASc score of 3.  Recent device interrogation revealed low rhythm burden.  She continues on Eliquis for stroke prophylaxis along with flecainide and Toprol-XL.  Last echocardiogram was in 2019, plan to get an updated study to ensure no change in LVEF.  She had a GXT last year that did not demonstrate any proarrhythmia.  2.  Tachycardia-bradycardia syndrome with history of symptomatic post-termination pauses status post St. Jude pacemaker.  Continues to follow with Dr. Ladona Ridgel.  Recent device interrogation revealed normal function despite symptoms described above.  Continue observation for now.  Disposition:  Follow up  6 months.  Signed, Jonelle Sidle, M.D., F.A.C.C. [facility] at [facility]

## 2022-11-16 ENCOUNTER — Telehealth: Payer: Self-pay | Admitting: Cardiology

## 2022-11-16 ENCOUNTER — Ambulatory Visit: Payer: Medicare Other | Attending: Cardiology | Admitting: Cardiology

## 2022-11-16 ENCOUNTER — Ambulatory Visit: Payer: Medicare Other

## 2022-11-16 ENCOUNTER — Encounter: Payer: Self-pay | Admitting: Cardiology

## 2022-11-16 VITALS — BP 120/74 | HR 70 | Ht 70.0 in | Wt 155.6 lb

## 2022-11-16 DIAGNOSIS — I480 Paroxysmal atrial fibrillation: Secondary | ICD-10-CM | POA: Diagnosis not present

## 2022-11-16 DIAGNOSIS — I495 Sick sinus syndrome: Secondary | ICD-10-CM | POA: Diagnosis not present

## 2022-11-16 LAB — ECHOCARDIOGRAM COMPLETE
Area-P 1/2: 3.12 cm2
Calc EF: 72.3 %
Height: 70 in
S' Lateral: 2.6 cm
Single Plane A2C EF: 66.7 %
Single Plane A4C EF: 75.1 %
Weight: 2489.6 oz

## 2022-11-16 NOTE — Patient Instructions (Addendum)
Medication Instructions:  Your physician recommends that you continue on your current medications as directed. Please refer to the Current Medication list given to you today.  Labwork: none  Testing/Procedures: Your physician has requested that you have an echocardiogram. Echocardiography is a painless test that uses sound waves to create images of your heart. It provides your doctor with information about the size and shape of your heart and how well your heart's chambers and valves are working. This procedure takes approximately one hour. There are no restrictions for this procedure. Please do NOT wear cologne, perfume, aftershave, or lotions (deodorant is allowed). Please arrive 15 minutes prior to your appointment time.  Follow-Up: Your physician recommends that you schedule a follow-up appointment in: 6 months  Any Other Special Instructions Will Be Listed Below (If Applicable).  If you need a refill on your cardiac medications before your next appointment, please call your pharmacy. 

## 2022-11-16 NOTE — Telephone Encounter (Signed)
ECHO - 11/16/2022  [place] [facility]

## 2022-11-25 NOTE — Progress Notes (Signed)
Remote pacemaker transmission.   

## 2022-11-28 ENCOUNTER — Other Ambulatory Visit: Payer: Self-pay | Admitting: Cardiology

## 2022-11-28 NOTE — Telephone Encounter (Signed)
Prescription refill request for Eliquis received. Indication: PAF Last office visit: 11/16/22  Ival Bible MD Scr: 0.88 on 11/15/22  [facility] Age: 77 Weight: 70.6kg  Based on above findings Eliquis 5mg  twice daily is the appropriate dose.  Refill approved.

## 2022-12-01 ENCOUNTER — Encounter: Payer: Self-pay | Admitting: Internal Medicine

## 2022-12-01 ENCOUNTER — Ambulatory Visit: Payer: Medicare Other | Attending: Internal Medicine | Admitting: Internal Medicine

## 2022-12-01 VITALS — BP 114/74 | HR 66 | Ht 70.0 in | Wt 158.2 lb

## 2022-12-01 DIAGNOSIS — I495 Sick sinus syndrome: Secondary | ICD-10-CM | POA: Diagnosis not present

## 2022-12-01 DIAGNOSIS — I480 Paroxysmal atrial fibrillation: Secondary | ICD-10-CM | POA: Diagnosis not present

## 2022-12-01 LAB — CUP PACEART INCLINIC DEVICE CHECK
Battery Remaining Longevity: 127 mo
Battery Voltage: 3.02 V
Brady Statistic RA Percent Paced: 27 %
Brady Statistic RV Percent Paced: 0.15 %
Date Time Interrogation Session: 20240829091531
Implantable Lead Connection Status: 753985
Implantable Lead Connection Status: 753985
Implantable Lead Implant Date: 20230511
Implantable Lead Implant Date: 20230511
Implantable Lead Location: 753859
Implantable Lead Location: 753860
Implantable Pulse Generator Implant Date: 20230511
Lead Channel Impedance Value: 450 Ohm
Lead Channel Impedance Value: 537.5 Ohm
Lead Channel Pacing Threshold Amplitude: 1 V
Lead Channel Pacing Threshold Amplitude: 1 V
Lead Channel Pacing Threshold Amplitude: 1 V
Lead Channel Pacing Threshold Amplitude: 1 V
Lead Channel Pacing Threshold Pulse Width: 0.5 ms
Lead Channel Pacing Threshold Pulse Width: 0.5 ms
Lead Channel Pacing Threshold Pulse Width: 0.5 ms
Lead Channel Pacing Threshold Pulse Width: 0.5 ms
Lead Channel Sensing Intrinsic Amplitude: 12 mV
Lead Channel Sensing Intrinsic Amplitude: 3.4 mV
Lead Channel Setting Pacing Amplitude: 2 V
Lead Channel Setting Pacing Amplitude: 2.5 V
Lead Channel Setting Pacing Pulse Width: 0.5 ms
Lead Channel Setting Sensing Sensitivity: 2 mV
Pulse Gen Model: 2272
Pulse Gen Serial Number: 8061065

## 2022-12-01 NOTE — Patient Instructions (Signed)

## 2022-12-01 NOTE — Progress Notes (Signed)
HPI Mrs. Lambo returns today for followup of sinus node dysfunction. She is a pleasant 77 yo woman with PAF and sinus node dysfunction s/p PPM insertion due to long pauses. She is doing well. She denies chest pain or sob. She does not have palpitations. She has been on flecainide and is 99%arrhythmia free. She has rare and brief episodes of atrial fib/flutter. Allergies  Allergen Reactions   Cardizem [Diltiazem Hcl] Hives and Itching     Current Outpatient Medications  Medication Sig Dispense Refill   acetaminophen (TYLENOL) 650 MG CR tablet Take 650 mg daily as needed by mouth for pain.     chlorhexidine (PERIDEX) 0.12 % solution Use as directed in the mouth or throat daily as needed.     ELIQUIS 5 MG TABS tablet TAKE 1 TABLET BY MOUTH TWICE DAILY 60 tablet 5   flecainide (TAMBOCOR) 100 MG tablet TAKE 1 TABLET BY MOUTH TWICE DAILY 180 tablet 2   metoprolol succinate (TOPROL-XL) 25 MG 24 hr tablet TAKE 1/2 TABLET BY MOUTH DAILY 45 tablet 3   No current facility-administered medications for this visit.     Past Medical History:  Diagnosis Date   Diverticula of colon    Paroxysmal atrial fibrillation [facility])    Sarcoidosis 1973   Thyroid nodule    Dr. Mary Sella at [facility]   Typical atrial flutter (HCC)     ROS:   All systems reviewed and negative except as noted in the HPI.   Past Surgical History:  Procedure Laterality Date   ABLATION SAPHENOUS VEIN W/ RFA     BREAST BIOPSY     right   Cervical node     left bx   COLONOSCOPY  11/05/2010   Procedure: COLONOSCOPY;  Surgeon: Malissa Hippo, MD;  [facility];  Service: Endoscopy;  Laterality: N/A;  9:30 am   COLONOSCOPY N/A 02/22/2017   Procedure: COLONOSCOPY;  Surgeon: Malissa Hippo, MD;  [facility];  Service: Endoscopy;  Laterality: N/A;  730   HERNIA REPAIR     right   LOOP RECORDER REMOVAL N/A 08/12/2021   Procedure: LOOP RECORDER REMOVAL;  Surgeon: Marinus Maw, MD;  [facility]: [facility]  INVASIVE CV LAB;  Service: Cardiovascular;  Laterality: N/A;   PACEMAKER IMPLANT N/A 08/12/2021   Procedure: PACEMAKER IMPLANT;  Surgeon: Marinus Maw, MD;  Location: [facility] INVASIVE CV LAB;  Service: Cardiovascular;  Laterality: N/A;   POLYPECTOMY  02/22/2017   Procedure: POLYPECTOMY;  Surgeon: Malissa Hippo, MD;  [facility];  Service: Endoscopy;;  colon    TOTAL KNEE ARTHROPLASTY  11/02/2011   Procedure: TOTAL KNEE ARTHROPLASTY;  Surgeon: Loanne Drilling, MD;  [facility]: [facility];  Service: Orthopedics;  Laterality: Left;     Family History  Problem Relation Age of Onset   Colon cancer Mother      Social History   Socioeconomic History   Marital status: Married    Spouse name: Not on file   Number of children: Not on file   Years of education: Not on file   Highest education level: Not on file  Occupational History   Not on file  Tobacco Use   Smoking status: Never   Smokeless tobacco: Never  Vaping Use   Vaping status: Never Used  Substance and Sexual Activity   Alcohol use: Yes    Alcohol/week: 2.0 standard drinks of alcohol    Types: 2 Glasses of wine per week  Comment: Wine- Weekends ( maybe )   Drug use: No   Sexual activity: Not on file  Other Topics Concern   Not on file  Social History Narrative   Retired Scientist, 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 [place]   Social Determinants of Health   Financial Resource Strain: Not on file  Food Insecurity: Not on file  Transportation Needs: Not on file  Physical Activity: Not on file  Stress: Not on file  Social Connections: Not on file  Intimate Partner Violence: Not on file     BP 114/74   Pulse 66   Ht 5\' 10"  (1.778 m)   Wt 158 lb 3.2 oz (71.8 kg)   SpO2 96%   BMI 22.70 kg/m   Physical Exam:  Well appearing NAD HEENT: Unremarkable Neck:  No JVD, no thyromegally Lymphatics:  No adenopathy Back:  No CVA tenderness Lungs:  Clear HEART:  Regular rate rhythm, no murmurs, no rubs, no clicks Abd:   soft, positive bowel sounds, no organomegally, no rebound, no guarding Ext:  2 plus pulses, no edema, no cyanosis, no clubbing Skin:  No rashes no nodules Neuro:  CN II through XII intact, motor grossly intact  EKG - reviewed. NSR  DEVICE  Normal device function.  See PaceArt for details.   Assess/Plan:  Sinus node dysfunction - she is asymptomatic s/p PPM insertion.  PPM - her St. Jude DDD PM is working normally. We will recheck in several months. HTN - her bp is well controlled, actually low. She is asymptomatic. PAF - she is maintaining NSR.    Sharlot Gowda Rubie Ficco,MD

## 2022-12-27 ENCOUNTER — Other Ambulatory Visit: Payer: Self-pay | Admitting: Cardiology

## 2023-02-10 ENCOUNTER — Ambulatory Visit (INDEPENDENT_AMBULATORY_CARE_PROVIDER_SITE_OTHER): Payer: Medicare Other

## 2023-02-10 DIAGNOSIS — I495 Sick sinus syndrome: Secondary | ICD-10-CM | POA: Diagnosis not present

## 2023-02-10 LAB — CUP PACEART REMOTE DEVICE CHECK
Battery Remaining Longevity: 109 mo
Battery Remaining Percentage: 90 %
Battery Voltage: 3.02 V
Brady Statistic AP VP Percent: 1 %
Brady Statistic AP VS Percent: 17 %
Brady Statistic AS VP Percent: 1 %
Brady Statistic AS VS Percent: 83 %
Brady Statistic RA Percent Paced: 17 %
Brady Statistic RV Percent Paced: 1 %
Date Time Interrogation Session: 20241108030016
Implantable Lead Connection Status: 753985
Implantable Lead Connection Status: 753985
Implantable Lead Implant Date: 20230511
Implantable Lead Implant Date: 20230511
Implantable Lead Location: 753859
Implantable Lead Location: 753860
Implantable Pulse Generator Implant Date: 20230511
Lead Channel Impedance Value: 440 Ohm
Lead Channel Impedance Value: 490 Ohm
Lead Channel Pacing Threshold Amplitude: 1 V
Lead Channel Pacing Threshold Amplitude: 1 V
Lead Channel Pacing Threshold Pulse Width: 0.5 ms
Lead Channel Pacing Threshold Pulse Width: 0.5 ms
Lead Channel Sensing Intrinsic Amplitude: 12 mV
Lead Channel Sensing Intrinsic Amplitude: 2.3 mV
Lead Channel Setting Pacing Amplitude: 2 V
Lead Channel Setting Pacing Amplitude: 2.5 V
Lead Channel Setting Pacing Pulse Width: 0.5 ms
Lead Channel Setting Sensing Sensitivity: 2 mV
Pulse Gen Model: 2272
Pulse Gen Serial Number: 8061065

## 2023-02-21 NOTE — Progress Notes (Signed)
Remote pacemaker transmission.   

## 2023-03-24 DIAGNOSIS — R0981 Nasal congestion: Secondary | ICD-10-CM | POA: Diagnosis not present

## 2023-03-24 DIAGNOSIS — J029 Acute pharyngitis, unspecified: Secondary | ICD-10-CM | POA: Diagnosis not present

## 2023-03-24 DIAGNOSIS — J069 Acute upper respiratory infection, unspecified: Secondary | ICD-10-CM | POA: Diagnosis not present

## 2023-03-24 DIAGNOSIS — Z299 Encounter for prophylactic measures, unspecified: Secondary | ICD-10-CM | POA: Diagnosis not present

## 2023-05-05 DIAGNOSIS — H40013 Open angle with borderline findings, low risk, bilateral: Secondary | ICD-10-CM | POA: Diagnosis not present

## 2023-05-12 ENCOUNTER — Ambulatory Visit (INDEPENDENT_AMBULATORY_CARE_PROVIDER_SITE_OTHER): Payer: Medicare Other

## 2023-05-12 ENCOUNTER — Ambulatory Visit: Payer: Medicare Other

## 2023-05-12 DIAGNOSIS — I495 Sick sinus syndrome: Secondary | ICD-10-CM | POA: Diagnosis not present

## 2023-05-16 LAB — CUP PACEART REMOTE DEVICE CHECK
Battery Remaining Longevity: 108 mo
Battery Remaining Percentage: 88 %
Battery Voltage: 3.02 V
Brady Statistic AP VP Percent: 1 %
Brady Statistic AP VS Percent: 12 %
Brady Statistic AS VP Percent: 1 %
Brady Statistic AS VS Percent: 88 %
Brady Statistic RA Percent Paced: 12 %
Brady Statistic RV Percent Paced: 1 %
Date Time Interrogation Session: 20250207040013
Implantable Lead Connection Status: 753985
Implantable Lead Connection Status: 753985
Implantable Lead Implant Date: 20230511
Implantable Lead Implant Date: 20230511
Implantable Lead Location: 753859
Implantable Lead Location: 753860
Implantable Pulse Generator Implant Date: 20230511
Lead Channel Impedance Value: 440 Ohm
Lead Channel Impedance Value: 530 Ohm
Lead Channel Pacing Threshold Amplitude: 1 V
Lead Channel Pacing Threshold Amplitude: 1 V
Lead Channel Pacing Threshold Pulse Width: 0.5 ms
Lead Channel Pacing Threshold Pulse Width: 0.5 ms
Lead Channel Sensing Intrinsic Amplitude: 12 mV
Lead Channel Sensing Intrinsic Amplitude: 2 mV
Lead Channel Setting Pacing Amplitude: 2 V
Lead Channel Setting Pacing Amplitude: 2.5 V
Lead Channel Setting Pacing Pulse Width: 0.5 ms
Lead Channel Setting Sensing Sensitivity: 2 mV
Pulse Gen Model: 2272
Pulse Gen Serial Number: 8061065

## 2023-05-21 ENCOUNTER — Encounter: Payer: Self-pay | Admitting: Internal Medicine

## 2023-05-25 ENCOUNTER — Ambulatory Visit: Payer: Medicare Other | Attending: Cardiology | Admitting: Cardiology

## 2023-05-25 ENCOUNTER — Encounter: Payer: Self-pay | Admitting: Cardiology

## 2023-05-25 VITALS — BP 108/66 | HR 68 | Ht 70.0 in | Wt 156.0 lb

## 2023-05-25 DIAGNOSIS — I480 Paroxysmal atrial fibrillation: Secondary | ICD-10-CM | POA: Diagnosis not present

## 2023-05-25 DIAGNOSIS — I495 Sick sinus syndrome: Secondary | ICD-10-CM

## 2023-05-25 NOTE — Patient Instructions (Addendum)

## 2023-05-25 NOTE — Progress Notes (Signed)
    Cardiology Office Note  Date: 05/25/2023   ID: Shailah, Gibbins 01-28-1946, MRN [id number]  History of Present Illness: Danielle Nicholson is a 78 y.o. female last seen in August 2024.  She is here for a routine visit.  States that she has had six limited episodes of atrial fibrillation generally associated with palpitations and tachycardia.  Otherwise, no change in stamina or new exertional symptoms  St. Jude dual-chamber pacemaker in place with followed by Dr. Ladona Ridgel.  Recent device check indicated normal function with 1.5% AF burden.  We went over her medications.  She remains on Eliquis 5 mg twice daily, Toprol-XL 12.5 mg daily, and flecainide 100 mg twice daily.  No spontaneous bleeding problems.  I reviewed her ECG today which shows sinus rhythm with prolonged PR interval, low voltage, leftward axis, decreased R wave progression.  Physical Exam: VS:  BP 108/66 (BP Location: Left Arm, Patient Position: Sitting)   Pulse 68   Ht 5\' 10"  (1.778 m)   Wt 156 lb (70.8 kg)   SpO2 98%   BMI 22.38 kg/m , BMI Body mass index is 22.38 kg/m.  Wt Readings from Last 3 Encounters:  05/25/23 156 lb (70.8 kg)  12/01/22 158 lb 3.2 oz (71.8 kg)  11/16/22 155 lb 9.6 oz (70.6 kg)    General: Patient appears comfortable at rest. HEENT: Conjunctiva and lids normal. Neck: Supple, no elevated JVP or carotid bruits. Lungs: Clear to auscultation, nonlabored breathing at rest. Cardiac: Regular rate and rhythm, no S3 or significant systolic murmur, no pericardial rub.  ECG:  An ECG dated 11/16/2022 was personally reviewed today and demonstrated:  Sinus rhythm with leftward axis, low voltage, decreased R wave progression.  Labwork:  August 2024: Hemoglobin 14.3, platelets 208, BUN 19, creatinine 0.88, potassium 4.4, AST 21, ALT 19, cholesterol 163, triglycerides 67, HDL 70, LDL 80, TSH 1.69  Other Studies Reviewed Today:  Echocardiogram 07/17/2022:  1. Left ventricular ejection fraction, by  estimation, is 60 to 65%. The  left ventricle has normal function. The left ventricle has no regional  wall motion abnormalities. Left ventricular diastolic parameters were  normal.   2. Right ventricular systolic function is normal. The right ventricular  size is normal. There is normal pulmonary artery systolic pressure. The  estimated right ventricular systolic pressure is 31.1 mmHg.   3. The mitral valve is grossly normal. Trivial mitral valve  regurgitation.   4. The aortic valve is tricuspid. There is mild calcification of the  aortic valve. Aortic valve regurgitation is not visualized. No aortic  stenosis is present.   5. The inferior vena cava is normal in size with greater than 50%  respiratory variability, suggesting right atrial pressure of 3 mmHg.   Assessment and Plan:  1.  Paroxysmal atrial fibrillation and typical atrial flutter with CHA2DS2-VASc score of 3.  She does remain symptomatic with limited episodes, however overall rhythm burden is well-controlled at less than 2% based on recent device interrogation.  Plan to continue Eliquis, Toprol-XL, and flecainide and above indicated doses.   2.  Tachycardia-bradycardia syndrome with history of symptomatic post-termination pauses status post St. Jude pacemaker.  Keep follow-up with Dr. Ladona Ridgel.  Disposition:  Follow up  6 months.  Signed, Jonelle Sidle, M.D., F.A.C.C. [facility] at [facility]

## 2023-05-28 ENCOUNTER — Other Ambulatory Visit: Payer: Self-pay | Admitting: Cardiology

## 2023-05-29 NOTE — Telephone Encounter (Signed)
 Prescription refill request for Eliquis received. Indication: PAF Last office visit: 05/25/23  Ival Bible MD Scr: 0.88 on 11/15/22  [facility] Age: 78 Weight: 70.8kg  Based on above findings Eliquis 5mg  twice daily is the appropriate dose.  Refill approved.

## 2023-06-14 NOTE — Progress Notes (Signed)
 Remote pacemaker transmission.

## 2023-06-14 NOTE — Addendum Note (Signed)
 Addended by: Elease Etienne A on: 06/14/2023 10:15 AM   Modules accepted: Orders

## 2023-07-26 ENCOUNTER — Other Ambulatory Visit: Payer: Self-pay | Admitting: Cardiology

## 2023-08-11 ENCOUNTER — Ambulatory Visit (INDEPENDENT_AMBULATORY_CARE_PROVIDER_SITE_OTHER): Payer: Medicare Other

## 2023-08-11 DIAGNOSIS — I495 Sick sinus syndrome: Secondary | ICD-10-CM

## 2023-08-11 LAB — CUP PACEART REMOTE DEVICE CHECK
Battery Remaining Longevity: 106 mo
Battery Remaining Percentage: 86 %
Battery Voltage: 3.02 V
Brady Statistic AP VP Percent: 1 %
Brady Statistic AP VS Percent: 15 %
Brady Statistic AS VP Percent: 1 %
Brady Statistic AS VS Percent: 85 %
Brady Statistic RA Percent Paced: 14 %
Brady Statistic RV Percent Paced: 1 %
Date Time Interrogation Session: 20250509040013
Implantable Lead Connection Status: 753985
Implantable Lead Connection Status: 753985
Implantable Lead Implant Date: 20230511
Implantable Lead Implant Date: 20230511
Implantable Lead Location: 753859
Implantable Lead Location: 753860
Implantable Pulse Generator Implant Date: 20230511
Lead Channel Impedance Value: 460 Ohm
Lead Channel Impedance Value: 510 Ohm
Lead Channel Pacing Threshold Amplitude: 1 V
Lead Channel Pacing Threshold Amplitude: 1 V
Lead Channel Pacing Threshold Pulse Width: 0.5 ms
Lead Channel Pacing Threshold Pulse Width: 0.5 ms
Lead Channel Sensing Intrinsic Amplitude: 1.9 mV
Lead Channel Sensing Intrinsic Amplitude: 12 mV
Lead Channel Setting Pacing Amplitude: 2 V
Lead Channel Setting Pacing Amplitude: 2.5 V
Lead Channel Setting Pacing Pulse Width: 0.5 ms
Lead Channel Setting Sensing Sensitivity: 2 mV
Pulse Gen Model: 2272
Pulse Gen Serial Number: 8061065

## 2023-08-14 ENCOUNTER — Encounter: Payer: Self-pay | Admitting: Internal Medicine

## 2023-08-24 DIAGNOSIS — Z299 Encounter for prophylactic measures, unspecified: Secondary | ICD-10-CM | POA: Diagnosis not present

## 2023-08-24 DIAGNOSIS — Z7189 Other specified counseling: Secondary | ICD-10-CM | POA: Diagnosis not present

## 2023-08-24 DIAGNOSIS — R5383 Other fatigue: Secondary | ICD-10-CM | POA: Diagnosis not present

## 2023-08-24 DIAGNOSIS — Z1389 Encounter for screening for other disorder: Secondary | ICD-10-CM | POA: Diagnosis not present

## 2023-08-24 DIAGNOSIS — Z0000 Encounter for general adult medical examination without abnormal findings: Secondary | ICD-10-CM | POA: Diagnosis not present

## 2023-09-15 NOTE — Progress Notes (Signed)
 Remote pacemaker transmission.

## 2023-09-20 DIAGNOSIS — E041 Nontoxic single thyroid nodule: Secondary | ICD-10-CM | POA: Diagnosis not present

## 2023-09-25 ENCOUNTER — Other Ambulatory Visit: Payer: Self-pay | Admitting: Cardiology

## 2023-10-05 DIAGNOSIS — H26493 Other secondary cataract, bilateral: Secondary | ICD-10-CM | POA: Diagnosis not present

## 2023-10-05 DIAGNOSIS — H04123 Dry eye syndrome of bilateral lacrimal glands: Secondary | ICD-10-CM | POA: Diagnosis not present

## 2023-10-05 DIAGNOSIS — H43813 Vitreous degeneration, bilateral: Secondary | ICD-10-CM | POA: Diagnosis not present

## 2023-10-18 ENCOUNTER — Encounter: Payer: Self-pay | Admitting: Cardiology

## 2023-10-18 ENCOUNTER — Ambulatory Visit: Attending: Cardiology | Admitting: Cardiology

## 2023-10-18 VITALS — BP 114/62 | HR 66 | Ht 70.0 in | Wt 155.4 lb

## 2023-10-18 DIAGNOSIS — I480 Paroxysmal atrial fibrillation: Secondary | ICD-10-CM | POA: Diagnosis not present

## 2023-10-18 DIAGNOSIS — I495 Sick sinus syndrome: Secondary | ICD-10-CM

## 2023-10-18 DIAGNOSIS — Z950 Presence of cardiac pacemaker: Secondary | ICD-10-CM | POA: Diagnosis not present

## 2023-10-18 NOTE — Patient Instructions (Signed)
 Medication Instructions:   Your physician recommends that you continue on your current medications as directed. Please refer to the Current Medication list given to you today.  Labwork:  none  Testing/Procedures:  none  Follow-Up:  Your physician recommends that you schedule a follow-up appointment in: 4 months.   Any Other Special Instructions Will Be Listed Below (If Applicable).  If you need a refill on your cardiac medications before your next appointment, please call your pharmacy.

## 2023-10-18 NOTE — Progress Notes (Signed)
    Cardiology Office Note  Date: 10/18/2023   ID: Xandria, Gallaga 06/22/1945, MRN [id number]  History of Present Illness: Danielle Nicholson is a 78 y.o. female last seen in February.  She is here for a routine visit.  Reports intermittent breakthrough atrial fibrillation as before, no increasing frequency.  Did just have a more prolonged episode lasting about 2 days.  St. Jude dual-chamber pacemaker in place with followed by Dr. Waddell.  Device interrogation in May indicated 2.1% AF burden since August 2024.  I went over her medications.  No spontaneous bleeding problems reported on Eliquis .  She will have follow-up lab work with PCP in August.  I reviewed her ECG today which shows an atrial paced rhythm, QRS 72 ms and QTc 415 ms.  Physical Exam: VS:  BP 114/62   Pulse 66   Ht 5' 10 (1.778 m)   Wt 155 lb 6.4 oz (70.5 kg)   SpO2 99%   BMI 22.30 kg/m , BMI Body mass index is 22.3 kg/m.  Wt Readings from Last 3 Encounters:  10/18/23 155 lb 6.4 oz (70.5 kg)  05/25/23 156 lb (70.8 kg)  12/01/22 158 lb 3.2 oz (71.8 kg)    General: Patient appears comfortable at rest. HEENT: Conjunctiva and lids normal. Neck: Supple, no elevated JVP or carotid bruits. Lungs: Clear to auscultation, nonlabored breathing at rest. Cardiac: Regular rate and rhythm, no S3 or significant systolic murmur. Extremities: No pitting edema.  ECG:  An ECG dated 05/25/2023 was personally reviewed today and demonstrated:  Sinus rhythm with prolonged PR interval, low voltage, leftward axis, decreased R wave progression.  Labwork:  August 2024: Hemoglobin 14.3, platelets 208, BUN 19, creatinine 0.88, potassium 4.4, AST 21, ALT 19, cholesterol 163, triglycerides 67, HDL 70, LDL 80, TSH 1.69   Other Studies Reviewed Today:  No interval cardiac testing for review today.  Assessment and Plan:  1.  Paroxysmal atrial fibrillation and typical atrial flutter with CHA2DS2-VASc score of 3.  Symptomatically stable in  terms of breakthrough events and relatively low arrhythmia burden by device interrogation.  Continue Eliquis  5 mg twice daily, flecainide  100 mg twice daily, and Toprol -XL 12.5 mg daily.   2.  Tachycardia-bradycardia syndrome with history of symptomatic post-termination pauses status post St. Jude pacemaker.  Keep follow-up with Dr. Waddell.  Disposition:  Follow up 4 months.  Signed, Jayson JUDITHANN Sierras, M.D., F.A.C.C. [facility] at [facility]

## 2023-11-10 ENCOUNTER — Ambulatory Visit: Payer: Medicare Other

## 2023-11-10 DIAGNOSIS — I495 Sick sinus syndrome: Secondary | ICD-10-CM | POA: Diagnosis not present

## 2023-11-10 LAB — CUP PACEART REMOTE DEVICE CHECK
Battery Remaining Longevity: 104 mo
Battery Remaining Percentage: 84 %
Battery Voltage: 3.01 V
Brady Statistic AP VP Percent: 1 %
Brady Statistic AP VS Percent: 17 %
Brady Statistic AS VP Percent: 1 %
Brady Statistic AS VS Percent: 83 %
Brady Statistic RA Percent Paced: 16 %
Brady Statistic RV Percent Paced: 1 %
Date Time Interrogation Session: 20250808061912
Implantable Lead Connection Status: 753985
Implantable Lead Connection Status: 753985
Implantable Lead Implant Date: 20230511
Implantable Lead Implant Date: 20230511
Implantable Lead Location: 753859
Implantable Lead Location: 753860
Implantable Pulse Generator Implant Date: 20230511
Lead Channel Impedance Value: 460 Ohm
Lead Channel Impedance Value: 540 Ohm
Lead Channel Pacing Threshold Amplitude: 1 V
Lead Channel Pacing Threshold Amplitude: 1 V
Lead Channel Pacing Threshold Pulse Width: 0.5 ms
Lead Channel Pacing Threshold Pulse Width: 0.5 ms
Lead Channel Sensing Intrinsic Amplitude: 12 mV
Lead Channel Sensing Intrinsic Amplitude: 2.1 mV
Lead Channel Setting Pacing Amplitude: 2 V
Lead Channel Setting Pacing Amplitude: 2.5 V
Lead Channel Setting Pacing Pulse Width: 0.5 ms
Lead Channel Setting Sensing Sensitivity: 2 mV
Pulse Gen Model: 2272
Pulse Gen Serial Number: 8061065

## 2023-11-11 ENCOUNTER — Ambulatory Visit: Payer: Self-pay | Admitting: Internal Medicine

## 2023-11-26 ENCOUNTER — Other Ambulatory Visit: Payer: Self-pay | Admitting: Cardiology

## 2023-11-27 NOTE — Telephone Encounter (Signed)
 Prescription refill request for Eliquis  received. Indication: PAF Last office visit: 10/18/23  GORMAN Sierras MD Scr: 0.88 on 11/16/22  [facility] Age: 78 Weight: 70.5kg  Based on above findings Eliquis  5mg  twice daily is the appropriate dose.  Refill approved.

## 2023-11-28 DIAGNOSIS — I480 Paroxysmal atrial fibrillation: Secondary | ICD-10-CM | POA: Diagnosis not present

## 2023-11-28 DIAGNOSIS — R5383 Other fatigue: Secondary | ICD-10-CM | POA: Diagnosis not present

## 2023-11-28 DIAGNOSIS — Z79899 Other long term (current) drug therapy: Secondary | ICD-10-CM | POA: Diagnosis not present

## 2023-11-28 DIAGNOSIS — E7800 Pure hypercholesterolemia, unspecified: Secondary | ICD-10-CM | POA: Diagnosis not present

## 2023-11-28 DIAGNOSIS — Z0000 Encounter for general adult medical examination without abnormal findings: Secondary | ICD-10-CM | POA: Diagnosis not present

## 2023-11-28 DIAGNOSIS — Z299 Encounter for prophylactic measures, unspecified: Secondary | ICD-10-CM | POA: Diagnosis not present

## 2023-12-13 ENCOUNTER — Ambulatory Visit: Attending: Internal Medicine | Admitting: Internal Medicine

## 2023-12-13 VITALS — BP 108/70 | HR 59 | Ht 70.0 in | Wt 159.0 lb

## 2023-12-13 DIAGNOSIS — I480 Paroxysmal atrial fibrillation: Secondary | ICD-10-CM | POA: Diagnosis not present

## 2023-12-13 LAB — CUP PACEART INCLINIC DEVICE CHECK
Battery Remaining Longevity: 116 mo
Battery Voltage: 3.02 V
Brady Statistic RA Percent Paced: 17 %
Brady Statistic RV Percent Paced: 0.19 %
Date Time Interrogation Session: 20250910170158
Implantable Lead Connection Status: 753985
Implantable Lead Connection Status: 753985
Implantable Lead Implant Date: 20230511
Implantable Lead Implant Date: 20230511
Implantable Lead Location: 753859
Implantable Lead Location: 753860
Implantable Pulse Generator Implant Date: 20230511
Lead Channel Impedance Value: 450 Ohm
Lead Channel Impedance Value: 487.5 Ohm
Lead Channel Pacing Threshold Amplitude: 1 V
Lead Channel Pacing Threshold Amplitude: 1 V
Lead Channel Pacing Threshold Amplitude: 1 V
Lead Channel Pacing Threshold Amplitude: 1 V
Lead Channel Pacing Threshold Pulse Width: 0.5 ms
Lead Channel Pacing Threshold Pulse Width: 0.5 ms
Lead Channel Pacing Threshold Pulse Width: 0.5 ms
Lead Channel Pacing Threshold Pulse Width: 0.5 ms
Lead Channel Sensing Intrinsic Amplitude: 12 mV
Lead Channel Sensing Intrinsic Amplitude: 2.1 mV
Lead Channel Setting Pacing Amplitude: 2 V
Lead Channel Setting Pacing Amplitude: 2.5 V
Lead Channel Setting Pacing Pulse Width: 0.5 ms
Lead Channel Setting Sensing Sensitivity: 2 mV
Pulse Gen Model: 2272
Pulse Gen Serial Number: 8061065

## 2023-12-13 NOTE — Progress Notes (Signed)
 HPI Danielle Nicholson returns today for followup of sinus node dysfunction. She is a pleasant 78 yo woman with PAF and sinus node dysfunction s/p PPM insertion due to long pauses. She is doing well. She denies chest pain or sob. She does not have palpitations. She has been on flecainide  and is 99%arrhythmia free. She has rare and brief episodes of atrial fib/flutter.  Allergies  Allergen Reactions   Cardizem  [Diltiazem  Hcl] Hives and Itching     Current Outpatient Medications  Medication Sig Dispense Refill   acetaminophen  (TYLENOL ) 650 MG CR tablet Take 650 mg daily as needed by mouth for pain.     chlorhexidine  (PERIDEX ) 0.12 % solution Use as directed in the mouth or throat daily as needed.     ELIQUIS  5 MG TABS tablet TAKE 1 TABLET BY MOUTH TWICE DAILY 60 tablet 5   flecainide  (TAMBOCOR ) 100 MG tablet TAKE 1 TABLET BY MOUTH TWICE DAILY 180 tablet 2   metoprolol  succinate (TOPROL -XL) 25 MG 24 hr tablet TAKE 1/2 TABLET BY MOUTH DAILY 45 tablet 3   No current facility-administered medications for this visit.     Past Medical History:  Diagnosis Date   Diverticula of colon    Paroxysmal atrial fibrillation [facility])    Sarcoidosis 1973   Thyroid  nodule    Dr. Cheryl Daring at [facility]   Typical atrial flutter (HCC)     ROS:   All systems reviewed and negative except as noted in the HPI.   Past Surgical History:  Procedure Laterality Date   ABLATION SAPHENOUS VEIN W/ RFA     BREAST BIOPSY     right   Cervical node     left bx   COLONOSCOPY  11/05/2010   Procedure: COLONOSCOPY;  Surgeon: Claudis RAYMOND Rivet, MD;  [facility];  Service: Endoscopy;  Laterality: N/A;  9:30 am   COLONOSCOPY N/A 02/22/2017   Procedure: COLONOSCOPY;  Surgeon: Rivet Claudis RAYMOND, MD;  [facility];  Service: Endoscopy;  Laterality: N/A;  730   HERNIA REPAIR     right   LOOP RECORDER REMOVAL N/A 08/12/2021   Procedure: LOOP RECORDER REMOVAL;  Surgeon: Waddell Danielle ORN, MD;  Location: [facility]  INVASIVE CV LAB;  Service: Cardiovascular;  Laterality: N/A;   PACEMAKER IMPLANT N/A 08/12/2021   Procedure: PACEMAKER IMPLANT;  Surgeon: Waddell Danielle ORN, MD;  Location: [facility] INVASIVE CV LAB;  Service: Cardiovascular;  Laterality: N/A;   POLYPECTOMY  02/22/2017   Procedure: POLYPECTOMY;  Surgeon: Rivet Claudis RAYMOND, MD;  [facility];  Service: Endoscopy;;  colon    TOTAL KNEE ARTHROPLASTY  11/02/2011   Procedure: TOTAL KNEE ARTHROPLASTY;  Surgeon: Dempsey LULLA Moan, MD;  Location: [facility];  Service: Orthopedics;  Laterality: Left;     Family History  Problem Relation Age of Onset   Colon cancer Mother      Social History   Socioeconomic History   Marital status: Married    Spouse name: Not on file   Number of children: Not on file   Years of education: Not on file   Highest education level: Not on file  Occupational History   Not on file  Tobacco Use   Smoking status: Never   Smokeless tobacco: Never  Vaping Use   Vaping status: Never Used  Substance and Sexual Activity   Alcohol  use: Yes    Alcohol /week: 2.0 standard drinks of alcohol     Types: 2 Glasses of wine per  week    Comment: Wine- Weekends ( maybe )   Drug use: No   Sexual activity: Not on file  Other Topics Concern   Not on file  Social History Narrative   Retired Scientist, 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 [place]   Social Drivers of Health   Financial Resource Strain: Not on file  Food Insecurity: Not on file  Transportation Needs: Not on file  Physical Activity: Not on file  Stress: Not on file  Social Connections: Not on file  Intimate Partner Violence: Not on file     BP 108/70 (BP Location: Left Arm, Patient Position: Sitting, Cuff Size: Normal)   Pulse (!) 59   Ht 5' 10 (1.778 m)   Wt 159 lb (72.1 kg)   SpO2 96%   BMI 22.81 kg/m   Physical Exam:  Well appearing NAD HEENT: Unremarkable Neck:  No JVD, no thyromegally Lymphatics:  No adenopathy Back:  No CVA tenderness Lungs:  Clear with  no wheezes HEART:  Regular rate rhythm, no murmurs, no rubs, no clicks Abd:  soft, positive bowel sounds, no organomegally, no rebound, no guarding Ext:  2 plus pulses, no edema, no cyanosis, no clubbing Skin:  No rashes no nodules Neuro:  CN II through XII intact, motor grossly intact  EKG - NSR with atrial pacing, inferior MI, age undetermined.  DEVICE  Normal device function.  See PaceArt for details.   Assess/Plan: Sinus node dysfunction - she is asymptomatic s/p PPM insertion.  PPM - her St. Jude DDD PM is working normally. We will recheck in several months. HTN - her bp is well controlled, actually low. She is asymptomatic. PAF - she is maintaining NSR.    Danielle Chaselynn Kepple,MD

## 2023-12-13 NOTE — Patient Instructions (Signed)

## 2023-12-29 NOTE — Progress Notes (Signed)
 Remote PPM Transmission

## 2024-01-05 ENCOUNTER — Encounter (INDEPENDENT_AMBULATORY_CARE_PROVIDER_SITE_OTHER): Payer: Self-pay | Admitting: *Deleted

## 2024-01-10 DIAGNOSIS — Z23 Encounter for immunization: Secondary | ICD-10-CM | POA: Diagnosis not present

## 2024-02-09 ENCOUNTER — Ambulatory Visit: Payer: Medicare Other

## 2024-02-09 DIAGNOSIS — I495 Sick sinus syndrome: Secondary | ICD-10-CM

## 2024-02-11 LAB — CUP PACEART REMOTE DEVICE CHECK
Battery Remaining Longevity: 100 mo
Battery Remaining Percentage: 82 %
Battery Voltage: 3.02 V
Brady Statistic AP VP Percent: 1 %
Brady Statistic AP VS Percent: 29 %
Brady Statistic AS VP Percent: 1 %
Brady Statistic AS VS Percent: 71 %
Brady Statistic RA Percent Paced: 28 %
Brady Statistic RV Percent Paced: 1 %
Date Time Interrogation Session: 20251107030014
Implantable Lead Connection Status: 753985
Implantable Lead Connection Status: 753985
Implantable Lead Implant Date: 20230511
Implantable Lead Implant Date: 20230511
Implantable Lead Location: 753859
Implantable Lead Location: 753860
Implantable Pulse Generator Implant Date: 20230511
Lead Channel Impedance Value: 460 Ohm
Lead Channel Impedance Value: 550 Ohm
Lead Channel Pacing Threshold Amplitude: 1 V
Lead Channel Pacing Threshold Amplitude: 1 V
Lead Channel Pacing Threshold Pulse Width: 0.5 ms
Lead Channel Pacing Threshold Pulse Width: 0.5 ms
Lead Channel Sensing Intrinsic Amplitude: 1.5 mV
Lead Channel Sensing Intrinsic Amplitude: 12 mV
Lead Channel Setting Pacing Amplitude: 2 V
Lead Channel Setting Pacing Amplitude: 2.5 V
Lead Channel Setting Pacing Pulse Width: 0.5 ms
Lead Channel Setting Sensing Sensitivity: 2 mV
Pulse Gen Model: 2272
Pulse Gen Serial Number: 8061065

## 2024-02-12 ENCOUNTER — Ambulatory Visit: Payer: Self-pay | Admitting: Internal Medicine

## 2024-02-13 NOTE — Progress Notes (Signed)
 Remote PPM Transmission

## 2024-02-21 ENCOUNTER — Ambulatory Visit: Attending: Cardiology | Admitting: Cardiology

## 2024-02-21 ENCOUNTER — Encounter: Payer: Self-pay | Admitting: Cardiology

## 2024-02-21 VITALS — BP 132/82 | HR 71 | Ht 70.0 in | Wt 154.0 lb

## 2024-02-21 DIAGNOSIS — I480 Paroxysmal atrial fibrillation: Secondary | ICD-10-CM | POA: Diagnosis not present

## 2024-02-21 DIAGNOSIS — I495 Sick sinus syndrome: Secondary | ICD-10-CM | POA: Diagnosis not present

## 2024-02-21 DIAGNOSIS — Z950 Presence of cardiac pacemaker: Secondary | ICD-10-CM

## 2024-02-21 NOTE — Progress Notes (Signed)
    Cardiology Office Note  Date: 02/21/2024   ID: [name], [name] 1945/06/11, MRN [id number]  History of Present Illness: Danielle Nicholson is a 78 y.o. female last seen in July.  She is here for a routine visit.  Reports no definite increase in frequency of breakthrough palpitations on current regimen.  No specific change in stamina or new exertional symptoms.  St. Jude dual-chamber pacemaker in place with followed by Dr. Waddell, I reviewed his note from September.  Interval device interrogation shows normal function with low arrhythmia burden.  She will transition to Dr. Almetta for visit next year.  I went over her medications which are stable from a cardiac perspective.  She reports compliance with therapy.  I reviewed her lab work from August as well.  I reviewed her ECG today which shows normal sinus rhythm, QRS duration 72 ms.  Physical Exam: VS:  BP 132/82 (BP Location: Left Arm)   Pulse 71   Ht 5' 10 (1.778 m)   Wt 154 lb (69.9 kg)   SpO2 96%   BMI 22.10 kg/m , BMI Body mass index is 22.1 kg/m.  Wt Readings from Last 3 Encounters:  02/21/24 154 lb (69.9 kg)  12/13/23 159 lb (72.1 kg)  10/18/23 155 lb 6.4 oz (70.5 kg)    General: Patient appears comfortable at rest. HEENT: Conjunctiva and lids normal. Lungs: Clear to auscultation, nonlabored breathing at rest. Cardiac: Regular rate and rhythm, no S3 or significant systolic murmur. Extremities: No pitting edema.  ECG:  An ECG dated 12/13/2023 was personally reviewed today and demonstrated:  Atrial paced rhythm with leftward axis.  Labwork:  August 2025: Hemoglobin 14.6, platelets 227, BUN 17, creatinine 0.83, potassium 4.3, GFR 72, AST 21, ALT 18, cholesterol 163, triglycerides 73, HDL 67, LDL 82, TSH 2.18  Other Studies Reviewed Today:  No interval cardiac testing for review today.  Assessment and Plan:  1.  Paroxysmal atrial fibrillation and typical atrial flutter with CHA2DS2-VASc score of 3.  Symptomatic  with breakthrough episodes, although low rhythm burden based on device interrogation.  Tolerating current regimen including flecainide  100 mg twice daily, Toprol -XL 12.5 mg daily, and Eliquis  5 mg twice daily.  I reviewed her lab work from August.  QRS duration normal by ECG today in sinus rhythm.  She will establish with Dr. Almetta for EP follow-up next year.  If rhythm burden increases, could potentially consider switch to Tikosyn versus atrial fibrillation ablation.   2.  Tachycardia-bradycardia syndrome with history of symptomatic post-termination pauses status post St. Jude pacemaker.  Device interrogation normal this month.  Disposition:  Follow up 4 months.  Signed, Jayson JUDITHANN Sierras, M.D., F.A.C.C. [facility] at [facility]

## 2024-02-21 NOTE — Patient Instructions (Signed)
Medication Instructions:  ?Your physician recommends that you continue on your current medications as directed. Please refer to the Current Medication list given to you today. ? ? ?Labwork: ?None today ? ?Testing/Procedures: ?None today ? ?Follow-Up: ?4 months ? ?Any Other Special Instructions Will Be Listed Below (If Applicable). ? ?If you need a refill on your cardiac medications before your next appointment, please call your pharmacy. ? ?

## 2024-05-10 ENCOUNTER — Ambulatory Visit: Payer: Medicare Other

## 2024-06-13 ENCOUNTER — Ambulatory Visit: Admitting: Cardiology

## 2024-08-09 ENCOUNTER — Ambulatory Visit: Payer: Medicare Other
